# Patient Record
Sex: Male | Born: 1970 | Race: White | Hispanic: No | Smoking: Never smoker
Health system: Southern US, Community
[De-identification: ages and names within clinical notes are randomized; demographics above are authoritative.]

## PROBLEM LIST (undated history)

## (undated) DIAGNOSIS — N189 Chronic kidney disease, unspecified: Secondary | ICD-10-CM

## (undated) DIAGNOSIS — F329 Major depressive disorder, single episode, unspecified: Secondary | ICD-10-CM

## (undated) DIAGNOSIS — R0600 Dyspnea, unspecified: Secondary | ICD-10-CM

## (undated) DIAGNOSIS — F32A Depression, unspecified: Secondary | ICD-10-CM

## (undated) DIAGNOSIS — T7840XA Allergy, unspecified, initial encounter: Secondary | ICD-10-CM

## (undated) HISTORY — DX: Allergy, unspecified, initial encounter: T78.40XA

## (undated) HISTORY — DX: Major depressive disorder, single episode, unspecified: F32.9

## (undated) HISTORY — DX: Depression, unspecified: F32.A

---

## 2003-05-21 ENCOUNTER — Encounter: Admission: RE | Admit: 2003-05-21 | Discharge: 2003-05-21 | Payer: Self-pay | Admitting: Family Medicine

## 2005-06-23 ENCOUNTER — Ambulatory Visit (HOSPITAL_COMMUNITY): Admission: RE | Admit: 2005-06-23 | Discharge: 2005-06-23 | Payer: Self-pay | Admitting: *Deleted

## 2006-04-05 HISTORY — PX: OTHER SURGICAL HISTORY: SHX169

## 2006-05-23 ENCOUNTER — Ambulatory Visit (HOSPITAL_COMMUNITY): Admission: RE | Admit: 2006-05-23 | Discharge: 2006-05-23 | Payer: Self-pay | Admitting: Urology

## 2010-04-26 ENCOUNTER — Encounter: Payer: Self-pay | Admitting: Family Medicine

## 2011-04-28 ENCOUNTER — Ambulatory Visit: Payer: Self-pay | Admitting: Family Medicine

## 2011-05-12 ENCOUNTER — Ambulatory Visit (INDEPENDENT_AMBULATORY_CARE_PROVIDER_SITE_OTHER): Payer: 59 | Admitting: Family Medicine

## 2011-05-12 ENCOUNTER — Encounter: Payer: Self-pay | Admitting: Family Medicine

## 2011-05-12 DIAGNOSIS — J302 Other seasonal allergic rhinitis: Secondary | ICD-10-CM

## 2011-05-12 DIAGNOSIS — J309 Allergic rhinitis, unspecified: Secondary | ICD-10-CM

## 2011-05-12 DIAGNOSIS — R61 Generalized hyperhidrosis: Secondary | ICD-10-CM | POA: Insufficient documentation

## 2011-05-12 DIAGNOSIS — Z0000 Encounter for general adult medical examination without abnormal findings: Secondary | ICD-10-CM

## 2011-05-12 DIAGNOSIS — J45990 Exercise induced bronchospasm: Secondary | ICD-10-CM | POA: Insufficient documentation

## 2011-05-12 LAB — HEPATIC FUNCTION PANEL
Bilirubin, Direct: 0.1 mg/dL (ref 0.0–0.3)
Total Bilirubin: 0.8 mg/dL (ref 0.3–1.2)
Total Protein: 7.8 g/dL (ref 6.0–8.3)

## 2011-05-12 LAB — LIPID PANEL
Cholesterol: 225 mg/dL — ABNORMAL HIGH (ref 0–200)
Total CHOL/HDL Ratio: 5
Triglycerides: 171 mg/dL — ABNORMAL HIGH (ref 0.0–149.0)
VLDL: 34.2 mg/dL (ref 0.0–40.0)

## 2011-05-12 LAB — CBC WITH DIFFERENTIAL/PLATELET
Basophils Absolute: 0.1 10*3/uL (ref 0.0–0.1)
Eosinophils Absolute: 0.1 10*3/uL (ref 0.0–0.7)
HCT: 43.3 % (ref 39.0–52.0)
Hemoglobin: 15 g/dL (ref 13.0–17.0)
Lymphs Abs: 2 10*3/uL (ref 0.7–4.0)
MCHC: 34.6 g/dL (ref 30.0–36.0)
MCV: 92.8 fl (ref 78.0–100.0)
Neutro Abs: 4.1 10*3/uL (ref 1.4–7.7)
RDW: 13 % (ref 11.5–14.6)

## 2011-05-12 LAB — BASIC METABOLIC PANEL
Calcium: 9.8 mg/dL (ref 8.4–10.5)
Creatinine, Ser: 0.8 mg/dL (ref 0.4–1.5)

## 2011-05-12 LAB — TSH: TSH: 0.95 u[IU]/mL (ref 0.35–5.50)

## 2011-05-12 MED ORDER — ALBUTEROL SULFATE HFA 108 (90 BASE) MCG/ACT IN AERS: 2.0000 | INHALATION_SPRAY | RESPIRATORY_TRACT | Status: DC | PRN

## 2011-05-12 MED ORDER — FLUTICASONE PROPIONATE 50 MCG/ACT NA SUSP: 2.0000 | Freq: Every day | NASAL | Status: DC

## 2011-05-12 NOTE — Patient Instructions (Signed)
Schedule your complete physical at your convenience- you can eat before this! Continue the Zyrtec daily Add the Flonase- 2 sprays each nostril daily Use the Albuterol inhaler as needed for wheezing or shortness of breath We'll contact you w/ your lab results Call with any questions or concerns Welcome!  We're glad to have you!!!

## 2011-05-12 NOTE — Assessment & Plan Note (Signed)
New.  Unclear as to cause.  Onset correlated w/ recent illness.  Check labs to r/o infxn, thyroid abnormality.  Pt denies fevers.  Will follow.

## 2011-05-12 NOTE — Assessment & Plan Note (Signed)
New to provider.  Only symptomatic w/ running.  Has not used inhaler in years.  Will prescribe albuterol inhaler PRN.  Pt expressed understanding and is in agreement w/ plan.

## 2011-05-12 NOTE — Assessment & Plan Note (Signed)
New to provider.  Not well controlled on zyrtec alone.  Due to primary complaint of PND, will add nasal steroid to better control sxs.  Reviewed supportive care and red flags that should prompt return.  Pt expressed understanding and is in agreement w/ plan.

## 2011-05-12 NOTE — Progress Notes (Signed)
  Subjective:    Patient ID: Brian Holland, male    DOB: [birth year], 41 y.o.   MRN: [id number]  HPI New to establish.  Previous MD- Dr Toniann Fail at [facility].  Allergic rhinitis- has recently had 3 rounds of abx for what started as acute sinusitis (12/22).  Still having copious PND, decreased energy level.  Denies sneezing.  Taking OTC Zyrtec.  + itchy, watery eyes.  Mild nasal congestion.  No fevers.  No current ear pain.  Minimal cough.  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  Was previously using albuterol inhaler but not since 2008.  Denies wheezing or cough outside of exercise.  Night sweats- only head will sweat.  This started at time of sinus infection.  Is requiring him to sleep on a towel.  Occuring 5 out of 7 days.  Reports this does not change w/ ETOH intake (1-2 beers daily).  Denies sweating of body at this time.  No N/V/D, LAD, weight change.   Review of Systems For ROS see HPI     Objective:   Physical Exam  Constitutional: He is oriented to person, place, and time. He appears well-developed and well-nourished. No distress.  HENT:  Head: Normocephalic and atraumatic.       No TTP over sinuses + turbinate edema + PND TMs normal bilaterally  Eyes: Conjunctivae and EOM are normal. Pupils are equal, round, and reactive to light.  Neck: Normal range of motion. Neck supple. No thyromegaly present.  Cardiovascular: Normal rate, regular rhythm, normal heart sounds and intact distal pulses.   No murmur heard. Pulmonary/Chest: Effort normal and breath sounds normal. No respiratory distress. He has no wheezes.  Abdominal: Soft. Bowel sounds are normal. He exhibits no distension.  Musculoskeletal: He exhibits no edema.  Lymphadenopathy:    He has no cervical adenopathy.  Neurological: He is alert and oriented to person, place, and time. No cranial nerve deficit.  Skin: Skin is warm and dry.  Psychiatric: He has a normal mood and affect. His behavior is  normal.          Assessment & Plan:

## 2011-05-19 ENCOUNTER — Telehealth: Payer: Self-pay | Admitting: *Deleted

## 2011-05-19 NOTE — Telephone Encounter (Signed)
Thyroid is normal.  This is not the cause of his night sweats

## 2011-05-19 NOTE — Telephone Encounter (Signed)
Pt wife left vm wanting to discuss recent lab results, called and pt advised that he had discussed night sweats with MD Beverely Low during OV and there was no mention about his thyroid during the reading of his lab results, per MD Beverely Low thought this was possibly coming from thyroid concern, please advise

## 2011-05-20 NOTE — Telephone Encounter (Signed)
At this point, we don't know why he's having sweats.  The most reasonable answer is that it was due to his illness that started at the same time as the sweats.  Labs were normal.  Alcohol intake is low and not necessarily the cause but if he is still having the sweats he will need additional workup.

## 2011-05-20 NOTE — Telephone Encounter (Signed)
Called pt to advise instructions from MD Tabori, noted wife answered and requested instructions noted pt signed waiver for wife to have information, advised that pt has a normal thyroid, pt wife asked what is the possible cause for the night sweats, I advised that MD Beverely Low noted this could be due to the daily alcohol intake, wife stated "He does not drink that much, but ok, please send [place] a copy of those labs please" I advised that I will mail those to the verified address in the chart

## 2011-05-20 NOTE — Telephone Encounter (Signed)
Spoke to wife and gave instructions in prior note per MD Beverely Low, pt wife understood and will have pt call back in if his sxs do not subside for another OV, advised that I did mail the results to the home address. Pt wife understood

## 2012-04-07 ENCOUNTER — Encounter: Payer: Self-pay | Admitting: Family Medicine

## 2012-04-07 ENCOUNTER — Ambulatory Visit (INDEPENDENT_AMBULATORY_CARE_PROVIDER_SITE_OTHER): Payer: 59 | Admitting: Family Medicine

## 2012-04-07 VITALS — BP 100/58 | HR 85 | Temp 98.2°F | Ht 73.75 in | Wt 241.4 lb

## 2012-04-07 DIAGNOSIS — J329 Chronic sinusitis, unspecified: Secondary | ICD-10-CM

## 2012-04-07 MED ORDER — GUAIFENESIN-CODEINE 100-10 MG/5ML PO SYRP: 10.0000 mL | ORAL_SOLUTION | Freq: Three times a day (TID) | ORAL | Status: DC | PRN

## 2012-04-07 MED ORDER — AZITHROMYCIN 250 MG PO TABS: ORAL_TABLET | ORAL | Status: DC

## 2012-04-07 NOTE — Progress Notes (Signed)
  Subjective:    Patient ID: Brian Holland, male    DOB: 01-07-1971, 42 y.o.   MRN: [id number]  HPI URI- sxs started yesterday w/ excessive fatigue.  Woke up this AM w/ 'a pressure HA and my sinuses were completely full'.  + PND.  No fevers.  Mild nausea, no vomiting.  L ear pain.  + sore throat.  + dry cough.  No known sick contacts.   Review of Systems For ROS see HPI     Objective:   Physical Exam  Vitals reviewed. Constitutional: He appears well-developed and well-nourished. No distress.  HENT:  Head: Normocephalic and atraumatic.  Right Ear: Tympanic membrane normal.  Left Ear: Tympanic membrane normal.  Nose: Mucosal edema and rhinorrhea present. Right sinus exhibits maxillary sinus tenderness (mild) and frontal sinus tenderness (mild). Left sinus exhibits maxillary sinus tenderness (mild) and frontal sinus tenderness (mild).  Mouth/Throat: Mucous membranes are normal. Oropharyngeal exudate and posterior oropharyngeal erythema present. No posterior oropharyngeal edema.       + PND  Eyes: Conjunctivae normal and EOM are normal. Pupils are equal, round, and reactive to light.  Neck: Normal range of motion. Neck supple.  Cardiovascular: Normal rate, regular rhythm and normal heart sounds.   Pulmonary/Chest: Effort normal and breath sounds normal. No respiratory distress. He has no wheezes.       + hacking cough  Lymphadenopathy:    He has no cervical adenopathy.  Skin: Skin is warm and dry.          Assessment & Plan:

## 2012-04-07 NOTE — Patient Instructions (Addendum)
This is an early illness- difficult to say at this time whether it's viral or bacterial If your symptoms don't improve or worsen, start the Zpack as directed Use the cough syrup as needed- it will make you drowsy Mucinex to thin your congestion Tylenol/ibuprofen for pain or fever REST! Hang in there!!

## 2012-04-09 NOTE — Assessment & Plan Note (Signed)
New.  Pt's sxs are very early and discussed the difficulty in distinguishing viral from bacterial infxn at this time.  With coming weekend, will send Zpack for pt to start if sxs worsen.  Reviewed supportive care and red flags that should prompt return.  Pt expressed understanding and is in agreement w/ plan.

## 2012-04-14 ENCOUNTER — Telehealth: Payer: Self-pay | Admitting: Family Medicine

## 2012-04-14 NOTE — Telephone Encounter (Signed)
[facility] Triage Call Report Triage Record Num: [id number] Operator: Albertine Grates Patient Name: Brian Holland Call Date & Time: 04/13/2012 5:44:06PM Patient Phone: [phone] PCP: Lezlie Octave Patient Gender: Male PCP Fax : [phone] Patient DOB: 1970/04/09 Practice Name: [facility] Reason for Call: Caller: Cindy/Spouse; PCP: Sheliah Hatch.; CB#: [phone]; Was seen in office 1-3 and diagnosed with sinus infection. Has finished Z Pack. Continues to have cough/cold 1-9.Afebrile. Continues to cough up green mucus 1-9. Per Cough protocol, advised appointment 1-10 but declines. States patient is working and cannt get to office. Will call fror appointment 1-11. Protocol(s) Used: Cough - Adult Recommended Outcome per Protocol: See Provider within 24 hours Reason for Outcome: Productive cough with colored sputum (other than clear or white sputum) Care Advice: Increase fluids to 8-12 eight oz (1.6 to 2.4 liters) glasses per day, half of them to be water. Soups, popsicles, fruit juices, non-caffeinated sodas (unless restricting sodium intake), jello, broths, decaf teas, etc. are all okay. Warm fluids can be soothing. ~

## 2012-05-02 ENCOUNTER — Telehealth: Payer: Self-pay | Admitting: Family Medicine

## 2012-05-02 DIAGNOSIS — J45990 Exercise induced bronchospasm: Secondary | ICD-10-CM

## 2012-05-02 NOTE — Telephone Encounter (Signed)
Patient Refused Recommendation:  Patient Refused Appt, Patient Requests Appt At Later Date  Wants Appt for Fri 1/31 when he is off

## 2012-05-02 NOTE — Telephone Encounter (Signed)
Patient Information:  Caller Name: Arline Asp  Phone: [phone]  Patient: Brian Holland, Brian Holland  Gender: Male  DOB: 1970-04-29  Age: 42 Years  PCP: Sheliah Hatch.  Office Follow Up:  Does the office need to follow up with this patient?: Yes  Instructions For The Office: Please review.  Would like to make appt for Fri 1/31.  RN Note:  Needing to be seen today or tomorrow especially since no meds or plan of care to use for wheezing.  Wife says he is the only person at work and can not leave.  Has Friday off and would like to make appt for that day.  Symptoms  Reason For Call & Symptoms: Seen in office on 04/07/2012 for cough, congestion, given ZPak, Robitussin AC.  Still dry cough.  Has been wheezing whenever walking on stairs for the last week. Can sit and Sx receed.     Hx of exercise  induced asthma in the past but does not have medications in case of flairup.  Reviewed Health History In EMR: Yes  Reviewed Medications In EMR: Yes  Reviewed Allergies In EMR: Yes  Reviewed Surgeries / Procedures: Yes  Date of Onset of Symptoms: 03/31/2012  Guideline(s) Used:  Asthma Attack  Disposition Per Guideline:   See Today or Tomorrow in Office  Reason For Disposition Reached:   Intermittent mild wheezing persists > 5 days  Advice Given:  Drinking Liquids:  Try to drink normal amount of liquids (e.g., water). Being adequately hydrated makes it easier to cough up the sticky lung mucus.  Humidifier:   If the air is dry, use a cool mist humidifier to prevent drying of the upper airway.  Patient Refused Recommendation:  Patient Refused Appt, Patient Requests Appt At Later Date  Wants Appt for Fri 1/31 when he is off

## 2012-05-02 NOTE — Telephone Encounter (Signed)
Spoke with the pt's wife to see how the pt was doing, and she stated again that the pt was having Asthma related sxs and wheezing with walking up stairs. The pt does not have any meds for the sxs.  Asked the wife if we could give the pt an appt.   She stated that the pt could not miss work, and that Friday would be the only day he could come in because he will be off on Friday.  The wife stated that she knows we are concern about her husband, but if he get worse she will take him to the ER.  Wife asked to schedule an appt for Friday.  Connected her to the front and an appt for Friday was made.//AB/CMA

## 2012-05-03 MED ORDER — ALBUTEROL SULFATE HFA 108 (90 BASE) MCG/ACT IN AERS: 2.0000 | INHALATION_SPRAY | RESPIRATORY_TRACT | Status: AC | PRN

## 2012-05-03 NOTE — Telephone Encounter (Signed)
Spoke with the pt's wife(Cindy) and asked how the pt was doing.  She stated that the pt was doing good no problems(wheezing or SOB).   Asked her if the pt was using his inhaler?  She stated that the pt did not have an inhaler.  Informed her that Dr. Beverely Low said we could refill his inhaler if he did not have any.  She stated it would be great if we would refill the inhaler.  Asked her if the pt was still going to be seen on Friday, and she said yes we will see him on Friday.  Rx sent to the pharmacy by e-script.//AB/CMA

## 2012-05-03 NOTE — Telephone Encounter (Signed)
Pt has hx of asthma and has albuterol on his med list- should be using his inhaler regularly.  If no albuterol available, please refill

## 2012-05-05 ENCOUNTER — Ambulatory Visit (INDEPENDENT_AMBULATORY_CARE_PROVIDER_SITE_OTHER): Payer: 59 | Admitting: Family Medicine

## 2012-05-05 ENCOUNTER — Encounter: Payer: Self-pay | Admitting: Family Medicine

## 2012-05-05 VITALS — BP 110/80 | HR 75 | Temp 98.0°F | Ht 73.75 in | Wt 244.8 lb

## 2012-05-05 DIAGNOSIS — R21 Rash and other nonspecific skin eruption: Secondary | ICD-10-CM | POA: Insufficient documentation

## 2012-05-05 DIAGNOSIS — R5381 Other malaise: Secondary | ICD-10-CM

## 2012-05-05 DIAGNOSIS — R5383 Other fatigue: Secondary | ICD-10-CM

## 2012-05-05 LAB — BASIC METABOLIC PANEL
CO2: 28 mEq/L (ref 19–32)
Chloride: 104 mEq/L (ref 96–112)
Sodium: 138 mEq/L (ref 135–145)

## 2012-05-05 LAB — CBC WITH DIFFERENTIAL/PLATELET
Basophils Absolute: 0.1 10*3/uL (ref 0.0–0.1)
Hemoglobin: 15 g/dL (ref 13.0–17.0)
Lymphocytes Relative: 34.1 % (ref 12.0–46.0)
Monocytes Relative: 5.7 % (ref 3.0–12.0)
Platelets: 167 10*3/uL (ref 150.0–400.0)
RDW: 13 % (ref 11.5–14.6)

## 2012-05-05 LAB — POCT RAPID STREP A (OFFICE): Rapid Strep A Screen: NEGATIVE

## 2012-05-05 MED ORDER — CLOTRIMAZOLE-BETAMETHASONE 1-0.05 % EX CREA: TOPICAL_CREAM | Freq: Two times a day (BID) | CUTANEOUS | Status: DC

## 2012-05-05 NOTE — Patient Instructions (Addendum)
Use the Lotrisone cream twice daily on the feet Use Hydrocortisone cream on the face twice daily We'll notify you of your lab results Continue the inhaler as needed for shortness of breath REST!  Give yourself time to recover Hang in there!!!

## 2012-05-05 NOTE — Progress Notes (Signed)
  Subjective:    Patient ID: Brian Holland, male    DOB: 1970-09-29, 42 y.o.   MRN: [id number]  HPI URI- pt reports decreased energy, increased fatigue, SOB w/ carrying groceries or going up stairs.  Pt reports he has not improved since visit on 1/3.  Felt better on Zpack but then sxs returned.  Intermittent cough.  No fevers.  No facial pain/pressure.  Denies nasal congestion but + PND.  Not using nasal steroid.  Albuterol inhaler has helped w/ SOB.  Rash- on feet bilaterally, very itchy.  Now spreading to face.  Nothing on hands, trunk or groin.  No one at home w/ similar.  Pt thinks he had something similar last time he had abx and was dx'd w/ fungal infxn   Review of Systems For ROS see HPI     Objective:   Physical Exam  Vitals reviewed. Constitutional: He appears well-developed and well-nourished. No distress.  HENT:  Head: Normocephalic and atraumatic.       No TTP over sinuses + turbinate edema + PND TMs normal bilaterally  Eyes: Conjunctivae normal and EOM are normal. Pupils are equal, round, and reactive to light.  Neck: Normal range of motion. Neck supple. No thyromegaly present.  Cardiovascular: Normal rate, regular rhythm and normal heart sounds.   Pulmonary/Chest: Effort normal and breath sounds normal. No respiratory distress. He has no wheezes. He has no rales.       No cough heard  Lymphadenopathy:    He has no cervical adenopathy.  Skin: Skin is warm and dry. Rash (macular, on dorsum of feet bilaterally.  initially started as separate 'red dots' that coalesced) noted. There is erythema (on dorsum of feet bilaterally).          Assessment & Plan:

## 2012-05-07 NOTE — Assessment & Plan Note (Signed)
New.  Rapid strep negative which makes scarlet fever unlikely.  Distribution is abnormal but rash is similar.  Start combo steroid/antifungal for symptom improvement.  If no improvement, will need derm referral.  Pt expressed understanding and is in agreement w/ plan.

## 2012-05-07 NOTE — Assessment & Plan Note (Signed)
New.  Suspect this is normal post-infectious recovery and pt just needs time to improve but will check labs to r/o underlying abnormality.  Reviewed supportive care and red flags that should prompt return.  Pt expressed understanding and is in agreement w/ plan.

## 2012-05-20 ENCOUNTER — Other Ambulatory Visit: Payer: Self-pay

## 2012-06-13 ENCOUNTER — Encounter: Payer: Self-pay | Admitting: Family Medicine

## 2012-06-13 ENCOUNTER — Ambulatory Visit (INDEPENDENT_AMBULATORY_CARE_PROVIDER_SITE_OTHER): Payer: 59 | Admitting: Family Medicine

## 2012-06-13 VITALS — BP 112/60 | HR 82 | Temp 98.2°F | Ht 73.75 in | Wt 248.8 lb

## 2012-06-13 DIAGNOSIS — M545 Low back pain, unspecified: Secondary | ICD-10-CM

## 2012-06-13 DIAGNOSIS — F418 Other specified anxiety disorders: Secondary | ICD-10-CM

## 2012-06-13 DIAGNOSIS — F341 Dysthymic disorder: Secondary | ICD-10-CM

## 2012-06-13 MED ORDER — NAPROXEN 500 MG PO TABS: 500.0000 mg | ORAL_TABLET | Freq: Two times a day (BID) | ORAL | Status: AC

## 2012-06-13 MED ORDER — ESCITALOPRAM OXALATE 10 MG PO TABS: 10.0000 mg | ORAL_TABLET | Freq: Every day | ORAL | Status: DC

## 2012-06-13 MED ORDER — CYCLOBENZAPRINE HCL 10 MG PO TABS: 10.0000 mg | ORAL_TABLET | Freq: Three times a day (TID) | ORAL | Status: DC | PRN

## 2012-06-13 NOTE — Patient Instructions (Addendum)
Follow up in 1 month to recheck mood Start the lexapro daily The back pain appears to be a lumbar strain from shoveling Start the Naproxen twice daily- take w/ food- for 7-10 days Use the flexeril at night and on weekends.  It will make you drowsy (muscle relaxer) HEAT! Call with any questions or concerns Hang in there!

## 2012-06-13 NOTE — Progress Notes (Signed)
  Subjective:    Patient ID: Brian Holland, male    DOB: 07-08-1970, 42 y.o.   MRN: [id number]  HPI Back pain- sxs started Friday after shoveling.  Pain is L sided, lumbar.  Also having some L abdominal pain.  Hx of kidney stones but 'this doesn't feel similar'.  No hematuria.  No TTP over spine but pain is worse w/ movement.  Particularly bad if pt has been stationary for prolonged period.  'it's better when i'm up and moving'.  Pain radiating into L buttock and thigh.  Some weakness of L leg.  Causing pt to limp.  Has taken tylenol, ibuprofen, ASA w/out relief.  No relief w/ heat or ice.  Anxiety/depression- pt reports he finds himself worrying frequently.  'i find myself not wanting to do things'.  + sadness.  Sleeping fine.  Has lost enjoyment in things he used to enjoy.  Has been treated for anxiety/depression previously- celexa.  Find this worked 'a little'.     Review of Systems For ROS see HPI     Objective:   Physical Exam  Vitals reviewed. Constitutional: He is oriented to person, place, and time. He appears well-developed and well-nourished.  Obviously uncomfortable w/ ambulation and movement  HENT:  Head: Normocephalic and atraumatic.  Musculoskeletal: He exhibits no edema.  No TTP over spine or paraspinals Pain on L w/ extension>flexion (-) SLR bilaterally  Neurological: He is alert and oriented to person, place, and time. He has normal reflexes. No cranial nerve deficit. Coordination normal.  Skin: Skin is warm and dry.  Psychiatric: He has a normal mood and affect. His behavior is normal. Thought content normal.          Assessment & Plan:

## 2012-06-13 NOTE — Assessment & Plan Note (Signed)
New.  Appears to be musculoskeletal from shoveling.  Not consistent w/ kidney stone- worse w/ movement.  Start scheduled NSAIDs, flexeril.  Heat.  Reviewed supportive care and red flags that should prompt return.  Pt expressed understanding and is in agreement w/ plan.

## 2012-06-13 NOTE — Assessment & Plan Note (Signed)
New to provider.  Pt has hx of similar.  Previously on Celexa- thought it was 'a little' helpful.  Start lexapro.  Follow closely.

## 2012-07-10 ENCOUNTER — Other Ambulatory Visit: Payer: Self-pay | Admitting: Family Medicine

## 2012-09-25 ENCOUNTER — Other Ambulatory Visit: Payer: Self-pay | Admitting: Family Medicine

## 2012-09-25 DIAGNOSIS — F418 Other specified anxiety disorders: Secondary | ICD-10-CM

## 2012-09-28 ENCOUNTER — Telehealth: Payer: Self-pay

## 2012-09-28 NOTE — Telephone Encounter (Signed)
lmom for call back 6/26-needs ov

## 2012-09-28 NOTE — Telephone Encounter (Signed)
Left message on voice mail for call back. Refill request for Lexapro, was to follow up in 1 month. Needs to schedule appt before refill. ghf

## 2012-09-29 NOTE — Telephone Encounter (Signed)
Called patient and advised needs follow up. Scheduled. Will refill meds until appt. On 10/26/2012  GHF///RN

## 2012-10-26 ENCOUNTER — Ambulatory Visit: Payer: 59 | Admitting: Family Medicine

## 2013-02-08 ENCOUNTER — Other Ambulatory Visit: Payer: Self-pay

## 2014-01-25 ENCOUNTER — Ambulatory Visit: Payer: 59 | Admitting: Medical

## 2014-03-05 ENCOUNTER — Encounter: Payer: Self-pay | Admitting: Family Medicine

## 2014-03-11 ENCOUNTER — Encounter: Payer: Self-pay | Admitting: Family Medicine

## 2015-09-14 ENCOUNTER — Emergency Department (HOSPITAL_COMMUNITY)
Admission: EM | Admit: 2015-09-14 | Discharge: 2015-09-15 | Disposition: A | Discharge status: Home / Self Care | Payer: 59 | Attending: Emergency Medicine | Admitting: Emergency Medicine

## 2015-09-14 ENCOUNTER — Emergency Department (HOSPITAL_COMMUNITY): Payer: 59

## 2015-09-14 ENCOUNTER — Encounter (HOSPITAL_COMMUNITY): Payer: Self-pay | Admitting: Emergency Medicine

## 2015-09-14 DIAGNOSIS — K6389 Other specified diseases of intestine: Secondary | ICD-10-CM | POA: Insufficient documentation

## 2015-09-14 DIAGNOSIS — K529 Noninfective gastroenteritis and colitis, unspecified: Secondary | ICD-10-CM

## 2015-09-14 DIAGNOSIS — F329 Major depressive disorder, single episode, unspecified: Secondary | ICD-10-CM | POA: Diagnosis not present

## 2015-09-14 DIAGNOSIS — R1032 Left lower quadrant pain: Secondary | ICD-10-CM | POA: Diagnosis present

## 2015-09-14 DIAGNOSIS — J45909 Unspecified asthma, uncomplicated: Secondary | ICD-10-CM | POA: Diagnosis not present

## 2015-09-14 DIAGNOSIS — Z7951 Long term (current) use of inhaled steroids: Secondary | ICD-10-CM | POA: Insufficient documentation

## 2015-09-14 LAB — URINALYSIS, ROUTINE W REFLEX MICROSCOPIC
BILIRUBIN URINE: NEGATIVE
Glucose, UA: NEGATIVE mg/dL
HGB URINE DIPSTICK: NEGATIVE
Ketones, ur: NEGATIVE mg/dL
Leukocytes, UA: NEGATIVE
Nitrite: NEGATIVE
PH: 6.5 (ref 5.0–8.0)
Protein, ur: NEGATIVE mg/dL
SPECIFIC GRAVITY, URINE: 1.01 (ref 1.005–1.030)

## 2015-09-14 LAB — COMPREHENSIVE METABOLIC PANEL
ALBUMIN: 4.1 g/dL (ref 3.5–5.0)
ALK PHOS: 66 U/L (ref 38–126)
ALT: 20 U/L (ref 17–63)
ANION GAP: 6 (ref 5–15)
AST: 19 U/L (ref 15–41)
BUN: 12 mg/dL (ref 6–20)
CHLORIDE: 107 mmol/L (ref 101–111)
CO2: 28 mmol/L (ref 22–32)
Calcium: 9 mg/dL (ref 8.9–10.3)
Creatinine, Ser: 0.94 mg/dL (ref 0.61–1.24)
GFR calc Af Amer: >60 mL/min (ref >60)
GFR calc non Af Amer: >60 mL/min (ref >60)
GLUCOSE: 105 mg/dL — AB (ref 65–99)
POTASSIUM: 3.9 mmol/L (ref 3.5–5.1)
SODIUM: 141 mmol/L (ref 135–145)
Total Bilirubin: 0.7 mg/dL (ref 0.3–1.2)
Total Protein: 6.9 g/dL (ref 6.5–8.1)

## 2015-09-14 LAB — CBC
HEMATOCRIT: 40.9 % (ref 39.0–52.0)
HEMOGLOBIN: 14.6 g/dL (ref 13.0–17.0)
MCH: 31 pg (ref 26.0–34.0)
MCHC: 35.7 g/dL (ref 30.0–36.0)
MCV: 86.8 fL (ref 78.0–100.0)
Platelets: 156 10*3/uL (ref 150–400)
RBC: 4.71 MIL/uL (ref 4.22–5.81)
RDW: 12.4 % (ref 11.5–15.5)
WBC: 9.1 10*3/uL (ref 4.0–10.5)

## 2015-09-14 LAB — LIPASE, BLOOD: LIPASE: 31 U/L (ref 11–51)

## 2015-09-14 MED ORDER — KETOROLAC TROMETHAMINE 30 MG/ML IJ SOLN
30.0000 mg | Freq: Once | INTRAMUSCULAR | Status: AC
  Administered 2015-09-14: 30 mg via INTRAVENOUS
  Filled 2015-09-14: qty 1

## 2015-09-14 MED ORDER — IOPAMIDOL (ISOVUE-300) INJECTION 61%
100.0000 mL | Freq: Once | INTRAVENOUS | Status: AC | PRN
  Administered 2015-09-14: 100 mL via INTRAVENOUS

## 2015-09-14 NOTE — ED Notes (Signed)
Pt c/o LLQ abdominal pain radiating into abdomen onset Friday. Pt states 2 weeks ago he passed a kidney stone. No dysuria, frequency, n/v/diarrhea. Pt states that stream is interrupted, starts and stops.

## 2015-09-15 MED ORDER — OXYCODONE-ACETAMINOPHEN 5-325 MG PO TABS
1.0000 | ORAL_TABLET | Freq: Once | ORAL | Status: AC
  Administered 2015-09-15: 1 via ORAL
  Filled 2015-09-15: qty 1

## 2015-09-15 MED ORDER — HYDROCODONE-ACETAMINOPHEN 5-325 MG PO TABS: 1.0000 | ORAL_TABLET | Freq: Four times a day (QID) | ORAL | Status: DC | PRN

## 2015-09-15 MED ORDER — IBUPROFEN 800 MG PO TABS: 800.0000 mg | ORAL_TABLET | Freq: Three times a day (TID) | ORAL | Status: AC

## 2015-09-15 NOTE — ED Provider Notes (Signed)
CSN: [id number]     Arrival date & time 09/14/15  1850 History   First MD Initiated Contact with Patient 09/14/15 2156     Chief Complaint  Patient presents with  . Abdominal Pain    (Consider location/radiation/quality/duration/timing/severity/associated sxs/prior Treatment) Patient is a 45 y.o. male presenting with abdominal pain. The history is provided by the patient and medical records. No language interpreter was used.  Abdominal Pain Associated symptoms: no fever, no nausea and no vomiting     HIREN PEPLINSKI is a 45 y.o. male  with a PMH of asthma who presents to the Emergency Department complaining of aching non-radiating waxing-waning LLQ abdominal pain x 2 days. Associated symptoms include 1-2 episodes of loose stool. Denies nausea, vomiting, fever, back pain, chest pain. Tolerating PO fine. Tylenol taken PTA with little relief in symptoms. No aggravating or alleviating factors noted.  Two weeks ago he had left back pain which radiated toward LLQ and passed a kidney stone. Presentation today feels different but is in the same area.  Past Medical History  Diagnosis Date  . Asthma   . Depression   . Allergy    Past Surgical History  Procedure Laterality Date  . Lithotripcy  2008   History reviewed. No pertinent family history. Social History  Substance Use Topics  . Smoking status: Never Smoker   . Smokeless tobacco: None  . Alcohol Use: Yes     Comment: daily    Review of Systems  Constitutional: Negative for fever.  Gastrointestinal: Positive for abdominal pain. Negative for nausea and vomiting.   10 Systems reviewed and are negative for acute change except as noted in the HPI.   Allergies  Review of patient's allergies indicates no known allergies.  Home Medications   Prior to Admission medications   Medication Sig Start Date End Date Taking? Authorizing Provider  fluticasone (FLONASE) 50 MCG/ACT nasal spray USE TWO SPRAYS IN EACH NOSTRIL DAILY 07/10/12   Yes Sheliah Hatch, MD  levocetirizine (XYZAL) 5 MG tablet Take 5 mg by mouth every evening.   Yes Historical Provider, MD  albuterol (PROVENTIL HFA;VENTOLIN HFA) 108 (90 BASE) MCG/ACT inhaler Inhale 2 puffs into the lungs every 4 (four) hours as needed for wheezing or shortness of breath. 05/03/12 05/03/13  Sheliah Hatch, MD  clotrimazole-betamethasone (LOTRISONE) cream Apply topically 2 (two) times daily. 05/05/12   Sheliah Hatch, MD  cyclobenzaprine (FLEXERIL) 10 MG tablet Take 1 tablet (10 mg total) by mouth 3 (three) times daily as needed for muscle spasms. 06/13/12   Sheliah Hatch, MD  escitalopram (LEXAPRO) 10 MG tablet TAKE ONE TABLET BY MOUTH ONE TIME DAILY 09/25/12   Sheliah Hatch, MD  HYDROcodone-acetaminophen (NORCO/VICODIN) 5-325 MG tablet Take 1 tablet by mouth every 6 (six) hours as needed for severe pain. 09/15/15   Jaime Pilcher Ward, PA-C  ibuprofen (ADVIL,MOTRIN) 800 MG tablet Take 1 tablet (800 mg total) by mouth 3 (three) times daily. 09/15/15   Jaime Pilcher Ward, PA-C   BP 124/76 mmHg  Pulse 58  Temp(Src) 98.1 F (36.7 C) (Oral)  Resp 18  SpO2 99% Physical Exam  Constitutional: He is oriented to person, place, and time. He appears well-developed and well-nourished.  Alert and in no acute distress  HENT:  Head: Normocephalic and atraumatic.  Cardiovascular: Normal rate, regular rhythm, normal heart sounds and intact distal pulses.  Exam reveals no gallop and no friction rub.   No murmur heard. Pulmonary/Chest: Effort normal and breath sounds  normal. No respiratory distress. He has no wheezes. He has no rales. He exhibits no tenderness.  Abdominal: Soft. Bowel sounds are normal. He exhibits no distension and no mass. There is tenderness (LLQ focal tenderness). There is rebound (Mild). There is no guarding.  No CVA tenderness.   Musculoskeletal: He exhibits no edema.  Neurological: He is alert and oriented to person, place, and time.  Skin: Skin is warm  and dry.  Nursing note and vitals reviewed.   ED Course  Procedures (including critical care time) Labs Review Labs Reviewed  COMPREHENSIVE METABOLIC PANEL - Abnormal; Notable for the following:    Glucose, Bld 105 (*)    All other components within normal limits  LIPASE, BLOOD  CBC  URINALYSIS, ROUTINE W REFLEX MICROSCOPIC (NOT AT [facility])    Imaging Review Ct Abdomen Pelvis W Contrast  09/14/2015  CLINICAL DATA:  Acute onset of left lower quadrant abdominal pain. Initial encounter. EXAM: CT ABDOMEN AND PELVIS WITH CONTRAST TECHNIQUE: Multidetector CT imaging of the abdomen and pelvis was performed using the standard protocol following bolus administration of intravenous contrast. CONTRAST:  ISOVUE-300 IOPAMIDOL (ISOVUE-300) INJECTION 61% COMPARISON:  Abdominal radiograph performed 05/30/2006, and CT of the abdomen and pelvis performed 05/09/2006 FINDINGS: The visualized lung bases are clear. A 1.8 cm cyst is noted at the right hepatic lobe. The liver is otherwise unremarkable. The spleen is enlarged, measuring 14.4 cm in length. The gallbladder is within normal limits. The pancreas and adrenal glands are unremarkable. A 9 mm nonobstructing stone is noted at the upper pole of the left kidney. Minimal bilateral perinephric stranding is seen. The kidneys are otherwise unremarkable in appearance. There is no evidence of hydronephrosis. No obstructing ureteral stones are identified. No free fluid is identified. The small bowel is unremarkable in appearance. The stomach is within normal limits. No acute vascular abnormalities are seen. The appendix is normal in caliber, without evidence of appendicitis. Mild soft tissue inflammation is noted about the proximal sigmoid colon, surrounding a lobule of fat, thought to reflect epiploic appendagitis. No significant diverticulosis is seen. The bladder is mildly distended. The prostate remains normal in size. No inguinal lymphadenopathy is seen. No acute  osseous abnormalities are identified. IMPRESSION: 1. Mild soft tissue inflammation about the proximal sigmoid colon, surrounding a lobule of fat. This appearance is compatible with epiploic appendagitis. 2. Small hepatic cyst noted. 3. Splenomegaly. 4. 9 mm nonobstructing stone at the upper pole of the left kidney. Electronically Signed   By: Roanna Raider M.D.   On: 09/14/2015 23:48   I have personally reviewed and evaluated these images and lab results as part of my medical decision-making.   EKG Interpretation None      MDM   Final diagnoses:  Epiploic appendagitis   DYKE WEIBLE presents to ED for LLQ abdominal pain x 2 days. No fever, nausea, vomiting. On exam, afebrile and VSS. Focal TTP of LLQ. CBC, CMP, lipase, UA reviewed and reassuring. CT abdomen shows mild soft tissue inflammation around the proximal sigmoid colon with surrounding a lobule of fat consistent with epiploic appendagitis. Findings are consistent with tenderness on exam. We will treat with NSAIDs and short course of narcotic pain medication. Patient has PCP he can follow up with easily and appears reliable to do so. Very strict return precautions and follow-up instructions were given to patient and family at bedside. PCP follow-up strongly encouraged. All questions answered.  Patient discussed with Dr. Radford Pax who agrees with treatment plan.  [facility] Psych[facility] HospitalJaime Pilcher Ward, PA-C 09/15/15 0119  Paula LibraJohn Molpus, MD 09/15/15 [phone]

## 2015-09-15 NOTE — Discharge Instructions (Signed)
Take ibuprofen as directed until pain subsides. Take pain medication only as needed for severe pain - This can make you very drowsy - please do not drink alcohol, operate heavy machinery or drive on this medication. Follow up with your primary care physician in 1 week, earlier if symptoms worsen.   Please seek immediate care if you develop any of the following symptoms: The pain does not go away.  You have a fever.  You keep throwing up (vomiting). You pass bloody or black tarry stools.  There is bright red blood in the stool.  Constipation stays for more than 4 days. There is rectal pain.  You do not seem to be getting better.  You have any questions or concerns.

## 2015-09-15 NOTE — ED Notes (Signed)
Asher MuirJamie, PA at bedside.

## 2016-02-28 ENCOUNTER — Emergency Department (HOSPITAL_COMMUNITY)
Admission: EM | Admit: 2016-02-28 | Discharge: 2016-02-29 | Disposition: A | Discharge status: Home / Self Care | Payer: 59 | Attending: Emergency Medicine | Admitting: Emergency Medicine

## 2016-02-28 ENCOUNTER — Emergency Department (HOSPITAL_COMMUNITY): Payer: 59

## 2016-02-28 DIAGNOSIS — R0781 Pleurodynia: Secondary | ICD-10-CM

## 2016-02-28 DIAGNOSIS — W010XXA Fall on same level from slipping, tripping and stumbling without subsequent striking against object, initial encounter: Secondary | ICD-10-CM | POA: Insufficient documentation

## 2016-02-28 DIAGNOSIS — S00212A Abrasion of left eyelid and periocular area, initial encounter: Secondary | ICD-10-CM | POA: Insufficient documentation

## 2016-02-28 DIAGNOSIS — S62339A Displaced fracture of neck of unspecified metacarpal bone, initial encounter for closed fracture: Secondary | ICD-10-CM

## 2016-02-28 DIAGNOSIS — S62307A Unspecified fracture of fifth metacarpal bone, left hand, initial encounter for closed fracture: Secondary | ICD-10-CM | POA: Insufficient documentation

## 2016-02-28 DIAGNOSIS — Y929 Unspecified place or not applicable: Secondary | ICD-10-CM | POA: Diagnosis not present

## 2016-02-28 DIAGNOSIS — Y939 Activity, unspecified: Secondary | ICD-10-CM | POA: Insufficient documentation

## 2016-02-28 DIAGNOSIS — M25561 Pain in right knee: Secondary | ICD-10-CM | POA: Insufficient documentation

## 2016-02-28 DIAGNOSIS — Y999 Unspecified external cause status: Secondary | ICD-10-CM | POA: Insufficient documentation

## 2016-02-28 DIAGNOSIS — J45909 Unspecified asthma, uncomplicated: Secondary | ICD-10-CM | POA: Diagnosis not present

## 2016-02-28 DIAGNOSIS — Z23 Encounter for immunization: Secondary | ICD-10-CM | POA: Insufficient documentation

## 2016-02-28 DIAGNOSIS — S6992XA Unspecified injury of left wrist, hand and finger(s), initial encounter: Secondary | ICD-10-CM | POA: Diagnosis present

## 2016-02-28 MED ORDER — TETANUS-DIPHTH-ACELL PERTUSSIS 5-2.5-18.5 LF-MCG/0.5 IM SUSP
0.5000 mL | Freq: Once | INTRAMUSCULAR | Status: AC
  Administered 2016-02-28: 0.5 mL via INTRAMUSCULAR
  Filled 2016-02-28: qty 0.5

## 2016-02-28 MED ORDER — HYDROCODONE-ACETAMINOPHEN 5-325 MG PO TABS
2.0000 | ORAL_TABLET | Freq: Once | ORAL | Status: AC
  Administered 2016-02-28: 2 via ORAL
  Filled 2016-02-28: qty 2

## 2016-02-28 MED ORDER — HYDROCODONE-ACETAMINOPHEN 5-325 MG PO TABS: 1.0000 | ORAL_TABLET | Freq: Four times a day (QID) | ORAL | 0 refills | Status: DC | PRN

## 2016-02-28 NOTE — ED Triage Notes (Signed)
Pt c/o tripping on his shoelace and falling tonight. Pt L hand is swollen and painful. Also complaining of R knee pain and L rib pain where he landed. Laceration noted to L eyebrow. No LOC, no dizziness. A&Ox4. Ambulatory with a steady gait.

## 2016-02-28 NOTE — ED Provider Notes (Signed)
[facility]-EMERGENCY DEPT Provider Note   CSN: [id number] Arrival date & time: 02/28/16  2205     History   Chief Complaint Chief Complaint  Patient presents with  . Fall  . Hand Pain    HPI Brian Holland is a 45 y.o. male.  Patient presents to the emergency department with chief complaint of fall from yesterday. Patient states that he was tying his shoe, and tripped catching himself with his left hand and landing on his right knee. He also complains of left-sided rib pain and a small abrasion to his left eyebrow. He did not pass out. He denies any dizziness. He reports that the symptoms in his hand are worsened with movement and palpation. He also reports associated swelling. He states that his right knee is tender to palpation, but he has been able to ambulate. He also states the regarding his left rib pain it hurts when he leans backward and when he takes deep breath. He has not taken anything for her symptoms.   The history is provided by the patient. No language interpreter was used.    Past Medical History:  Diagnosis Date  . Allergy   . Asthma   . Depression     Patient Active Problem List   Diagnosis Date Noted  . Depression with anxiety 06/13/2012  . Lumbar back pain 06/13/2012  . Rash 05/05/2012  . Fatigue 05/05/2012  . Sinusitis 04/07/2012  . Allergic rhinitis, seasonal 05/12/2011  . Exercise-induced asthma 05/12/2011  . Night sweats 05/12/2011    Past Surgical History:  Procedure Laterality Date  . lithotripcy  2008       Home Medications    Prior to Admission medications   Medication Sig Start Date End Date Taking? Authorizing Provider  albuterol (PROVENTIL HFA;VENTOLIN HFA) 108 (90 BASE) MCG/ACT inhaler Inhale 2 puffs into the lungs every 4 (four) hours as needed for wheezing or shortness of breath. 05/03/12 05/03/13  Sheliah HatchKatherine E Tabori, MD  clotrimazole-betamethasone (LOTRISONE) cream Apply topically 2 (two) times daily. 05/05/12   Sheliah HatchKatherine E  Tabori, MD  cyclobenzaprine (FLEXERIL) 10 MG tablet Take 1 tablet (10 mg total) by mouth 3 (three) times daily as needed for muscle spasms. 06/13/12   Sheliah HatchKatherine E Tabori, MD  escitalopram (LEXAPRO) 10 MG tablet TAKE ONE TABLET BY MOUTH ONE TIME DAILY 09/25/12   Sheliah HatchKatherine E Tabori, MD  fluticasone [facility](FLONASE) 50 MCG/ACT nasal spray USE [facility] SPRAYS [facility] 07/10/12   Sheliah HatchKatherine E Tabori, MD  HYDROcodone-acetaminophen (NORCO/VICODIN) 5-325 MG tablet Take 1 tablet by mouth every 6 (six) hours as needed for severe pain. 09/15/15   Jaime Pilcher Ward, PA-C  ibuprofen (ADVIL,MOTRIN) 800 MG tablet Take 1 tablet (800 mg total) by mouth 3 (three) times daily. 09/15/15   Chase PicketJaime Pilcher Ward, PA-C  levocetirizine (XYZAL) 5 MG tablet Take 5 mg by mouth every evening.    Historical Provider, MD    Family History No family history on file.  Social History Social History  Substance Use Topics  . Smoking status: Never Smoker  . Smokeless tobacco: Not on file  . Alcohol use Yes     Comment: daily     Allergies   Patient has no known allergies.   Review of Systems Review of Systems  All other systems reviewed and are negative.    Physical Exam Updated Vital Signs BP 142/96 (BP Location: Left Arm)   Pulse 70   Temp 97.8 F (36.6 C) (Oral)   Resp 18  Ht 6\' 3"  (1.905 m)   Wt 115.7 kg   SpO2 99%   BMI 31.87 kg/m   Physical Exam  Constitutional: He is oriented to person, place, and time. He appears well-developed and well-nourished.  HENT:  Head: Normocephalic and atraumatic.  Eyes: Conjunctivae and EOM are normal. Pupils are equal, round, and reactive to light. Right eye exhibits no discharge. Left eye exhibits no discharge. No scleral icterus.  Neck: Normal range of motion. Neck supple. No JVD present.  Cardiovascular: Normal rate, regular rhythm and normal heart sounds.  Exam reveals no gallop and no friction rub.   No murmur heard. Pulmonary/Chest: Effort normal and breath sounds  normal. No respiratory distress. He has no wheezes. He has no rales. He exhibits no tenderness.  Left RIBS nontender to palpation Clear to auscultation  Abdominal: Soft. He exhibits no distension and no mass. There is no tenderness. There is no rebound and no guarding.  Musculoskeletal: Normal range of motion. He exhibits no edema or tenderness.  Left upper extremity: Left hand tender to palpation over the fifth metacarpal, moderate swelling, range of motion strength limited secondary to pain, left wrist range of motion strength 5/5  Right knee tender to palpation anteriorly, no obvious swelling, no bony abnormality or deformity, range of motion strength 5/5, patient is ambulatory  Neurological: He is alert and oriented to person, place, and time.  Skin: Skin is warm and dry.  Mild abrasion to left eyebrow, no laceration requiring repair  Psychiatric: He has a normal mood and affect. His behavior is normal. Judgment and thought content normal.  Nursing note and vitals reviewed.    ED Treatments / Results  Labs (all labs ordered are listed, but only abnormal results are displayed) Labs Reviewed - No data to display  EKG  EKG Interpretation None       Radiology No results found.  Procedures Procedures (including critical care time) SPLINT APPLICATION Date/Time: 12:27 AM Authorized by: Roxy Horseman Consent: Verbal consent obtained. Risks and benefits: risks, benefits and alternatives were discussed Consent given by: patient Splint applied by: orthopedic technician Location details: left hand Splint type: ulnar gutter Supplies used: fiberglass and ace Post-procedure: The splinted body part was neurovascularly unchanged following the procedure. Patient tolerance: Patient tolerated the procedure well with no immediate complications.    Medications Ordered in ED Medications  HYDROcodone-acetaminophen (NORCO/VICODIN) 5-325 MG per tablet 2 tablet (not administered)  Tdap  (BOOSTRIX) injection 0.5 mL (not administered)     Initial Impression / Assessment and Plan / ED Course  I have reviewed the triage vital signs and the nursing notes.  Pertinent labs & imaging results that were available during my care of the patient were reviewed by me and considered in my medical decision making (see chart for details).  Clinical Course     Patient with mechanical fall yesterday. Left fifth metacarpal fracture. Will splint with an older gutter splint. Recommend orthopedic follow-up.  Laceration does not require repair, but the patient does need a tetanus booster.  Treatment pain, recommend ice, and close follow-up.  Incentive spirometry.  Patient X-Ray negative for obvious fracture or dislocation.  Pt advised to follow up with orthopedics. Patient given splint while in ED, conservative therapy recommended and discussed. Patient will be discharged home & is agreeable with above plan. Returns precautions discussed. Pt appears safe for discharge.   Final Clinical Impressions(s) / ED Diagnoses   Final diagnoses:  Closed boxer's fracture, initial encounter  Rib pain on left side  Acute pain of right knee    New Prescriptions Discharge Medication List as of 02/28/2016 11:56 PM       Roxy Horsemanobert Rickie Gange, PA-C 02/29/16 0028    Tilden FossaElizabeth Rees, MD 02/29/16 1540

## 2016-02-29 ENCOUNTER — Encounter: Payer: Self-pay | Admitting: Family Medicine

## 2016-04-13 DIAGNOSIS — S62347D Nondisplaced fracture of base of fifth metacarpal bone. left hand, subsequent encounter for fracture with routine healing: Secondary | ICD-10-CM | POA: Diagnosis not present

## 2016-05-04 DIAGNOSIS — S62347D Nondisplaced fracture of base of fifth metacarpal bone. left hand, subsequent encounter for fracture with routine healing: Secondary | ICD-10-CM | POA: Diagnosis not present

## 2016-05-31 DIAGNOSIS — S62347D Nondisplaced fracture of base of fifth metacarpal bone. left hand, subsequent encounter for fracture with routine healing: Secondary | ICD-10-CM | POA: Diagnosis not present

## 2016-06-23 DIAGNOSIS — J45909 Unspecified asthma, uncomplicated: Secondary | ICD-10-CM | POA: Diagnosis not present

## 2016-07-14 DIAGNOSIS — J45909 Unspecified asthma, uncomplicated: Secondary | ICD-10-CM | POA: Diagnosis not present

## 2016-08-25 DIAGNOSIS — R0982 Postnasal drip: Secondary | ICD-10-CM | POA: Diagnosis not present

## 2016-09-07 DIAGNOSIS — N202 Calculus of kidney with calculus of ureter: Secondary | ICD-10-CM | POA: Diagnosis not present

## 2016-09-21 DIAGNOSIS — N202 Calculus of kidney with calculus of ureter: Secondary | ICD-10-CM | POA: Diagnosis not present

## 2016-09-21 DIAGNOSIS — R8271 Bacteriuria: Secondary | ICD-10-CM | POA: Diagnosis not present

## 2016-09-22 ENCOUNTER — Other Ambulatory Visit: Payer: Self-pay | Admitting: Urology

## 2016-09-24 ENCOUNTER — Encounter (HOSPITAL_COMMUNITY): Payer: Self-pay | Admitting: *Deleted

## 2016-09-26 NOTE — H&P (Signed)
Office Visit Report     09/21/2016  --------------------------------------------------------------------------------  Brian Holland. Brian Holland  MRN: [id number]  PRIMARY CARE:    DOB: 08-19-1970, 46 year old Male  REFERRING:    SSN: *[id number]  PROVIDER:  Ihor Holland, M.D.    TREATING:  Brian Holland    LOCATION:  [facility] [phone]  -------------------------------------------------------------------------------  CC: I have kidney stones.  HPI: Brian Holland is a 46 year-old male established patient who is here for renal calculi. Calculus disease: He underwent ESWL for a left UPJ stone in 2/08 by Dr. Sherron Monday.  Stone analysis: Calcium oxalate 30% and calcium phosphate 60%.  24-hour urine: He was noted to have hypercalciuria and hyperoxaluria with mild hypocitraturia.  CT scan 09/14/15: 9 x 6 mm nonobstructing left upper pole renal calculus with Hounsfield units of ~680.  He has passed a total of 4 stones. Last stone he passed was in 3/17.  Stone risk: His serum studies were normal with a normal PTH and serum calcium. His 24-hour urine revealed a volume of 2.15 L with the only abnormality having been hyperoxaluria.  Treatment: Low oxalate diet.   Interval history 03/08/16: No new urologic complaints are noted. He has returned to go over the results of his stone risk evaluation.   Interval history 09/07/16: He said that about 2 months ago he began having intermittent pain in the left flank and left lower quadrant. About 2 weeks ago he had a 48 hour period of gross hematuria. He has not had any change in his voiding pattern. His pain is not modified by positional change.  June 19 interval: Stone noted at distal left ureter all last office visit imaging study. Patient denies much in the way of any stone material passage. Continues to have some intermittent left lower quadrant abdominal/groin pain and discomfort as well as some left flank pain. He has noted some progressively  worsening lower urinary tract symptoms mostly in the form of urgency and hesitancy. Denies dysuria or gross hematuria. Denies fevers. Pain has been managed with oxycodone. He remains on an alpha blocker as well.   The problem is on the left side. He first stated noticing pain on approximately 07/04/2016. This is not his first kidney stone. He has had 1 stones prior to getting this one. He is currently having groin pain. He denies having flank pain, back pain, nausea, vomiting, fever, and chills. He has not caught a stone in his urine strainer since his symptoms began.   He has had eswl for treatment of his stones in the past.    ALLERGIES: No Allergies  MEDICATIONS: None   GU PSH: ESWL  NON-GU PSH: None   GU PMH: Renal calculus (Stable), Left, His left lower pole renal stone remains unchanged. He has not developed any new right or left renal calculi. - 09/07/2016, (Stable), Left, Low oxalate diet and return in 6 months for KUB., - 03/08/2016 (Stable, Chronic), Left, He has passed 4 stones and has a single stone in his kidney now. We discussed repeating his 24-hour urine and serum studies for stone risk and then he'll return to go over those results., - 01/19/2016, Left, 9 mm, upper pole and nonobstructing, - 09/14/2015 Ureteral calculus (Acute), Left, He has a left distal ureteral stone that is 5 mm in width and we discussed the options for treatment. He is familiar with these. We discussed lithotripsy, ureteroscopy and medical expulsive therapy. He would like to proceed with medical expulsive therapy so I  will place him on Rapaflo and give him a prescription for pain medication with follow-up in 2 weeks for KUB. - 09/07/2016     PMH Notes:  1898-04-05 00:00:00 - Note: Normal Routine History And Physical Adult   NON-GU PMH: Asthma, Asthma - 2014 Anxiety Depression GERD   FAMILY HISTORY: None   SOCIAL HISTORY: Marital Status: Widowed Current Smoking Status: Patient has never smoked.  Social  Drinker.  Drinks 3 caffeinated drinks per day.     Notes: Tobacco Use, Caffeine Use, Occupation:, Marital History - Currently Married, Alcohol Use   REVIEW OF SYSTEMS:    GU Review Male:   Patient reports frequent urination. Patient denies hard to postpone urination, burning/ pain with urination, get up at night to urinate, leakage of urine, stream starts and stops, trouble starting your stream, have to strain to urinate , erection problems, and penile pain.  Gastrointestinal (Upper):   Patient denies nausea, vomiting, and indigestion/ heartburn.  Gastrointestinal (Lower):   Patient denies diarrhea and constipation.  Constitutional:   Patient denies fever, night sweats, weight loss, and fatigue.  Skin:   Patient denies skin rash/ lesion and itching.  Eyes:   Patient denies blurred vision and double vision.  Ears/ Nose/ Throat:   Patient reports sinus problems. Patient denies sore throat.  Hematologic/Lymphatic:   Patient denies swollen glands and easy bruising.  Cardiovascular:   Patient denies leg swelling and chest pains.  Respiratory:   Patient reports cough and shortness of breath.   Endocrine:   Patient denies excessive thirst.  Musculoskeletal:   Patient denies back pain and joint pain.  Neurological:   Patient denies headaches and dizziness.  Psychologic:   Patient reports anxiety. Patient denies depression.   VITAL SIGNS:      09/21/2016 03:47 PM  BP 126/78 mmHg  Pulse 69 /min   MULTI-SYSTEM PHYSICAL EXAMINATION:    Constitutional: Well-nourished. No physical deformities. Normally developed. Good grooming.  Respiratory: No labored breathing, no use of accessory muscles. RRR.  Cardiovascular: Normal temperature, normal extremity pulses, no swelling, no varicosities. CTA.  Skin: No paleness, no jaundice, no cyanosis. No lesion, no ulcer, no rash.  Neurologic / Psychiatric: Oriented to time, oriented to place, oriented to person. No depression, no anxiety, no agitation.   Gastrointestinal: No mass, no tenderness, no rigidity, non obese abdomen. No CVAT.  Musculoskeletal: Spine, ribs, pelvis no bilateral tenderness. Normal gait and station of head and neck.    PAST DATA REVIEWED:  Source Of History:  Patient, Family/Caregiver  Records Review:   Previous Patient Records  Urine Test Review:   Urinalysis  X-Ray Review: KUB: Reviewed Films.     09/21/16  Urinalysis  Urine Appearance Clear   Urine Color Yellow   Urine Glucose Neg   Urine Bilirubin Neg   Urine Ketones Neg   Urine Specific Gravity 1.015   Urine Blood Neg   Urine pH 6.5   Urine Protein Neg   Urine Urobilinogen 0.2   Urine Nitrites Neg   Urine Leukocyte Esterase Neg    PROCEDURES:         KUB - [id number]  A single view of the abdomen is obtained.     Stable nonobstructing calculi remains unchanged in the left lower pole. The previously noted left distal ureteral calculi overlying the left sacral edge may have descended a few millimeters but grossly remains unchanged measuring around 5 mm. No other obvious renal or ureteral calculi identified today. Bowel gas pattern appeared within normal limits.  Bladder appears free of obstruction. No new bony abnormalities.        Urinalysis Dipstick Dipstick Cont'd  Color: Yellow Bilirubin: Neg  Appearance: Clear Ketones: Neg  Specific Gravity: 1.015 Blood: Neg  pH: 6.5 Protein: Neg  Glucose: Neg Urobilinogen: 0.2    Nitrites: Neg    Leukocyte Esterase: Neg    ASSESSMENT:      ICD-10 Details  1 GU:   Renal calculus - N20.0 Left  2   Ureteral calculus - N20.1 Left   PLAN:          Medications Refill Meds: Oxycodone Hcl 10 mg tablet 1-2 tablet PO Q 6 H   #20  0 Refill(s)    New Meds: Tamsulosin Hcl 0.4 mg capsule, ext release 24 hr 1 capsule PO Daily   #30  2 Refill(s)  Tamsulosin Hcl 0.4 mg capsule, ext release 24 hr 1 capsule PO Daily   #30  2 Refill(s)         Orders Labs Urine Culture        Schedule Return Visit/Planned Activity: 1  Week - Schedule Surgery        Document Letter(s):  Created for Patient: Clinical Summary       Notes:   Patient is still moderately symptomatic. Stone has not moved much in the way of progression toward the bladder on medical expulsive therapy. I discussed with his urologist that the patient would be a good candidate for shockwave therapy or ureteroscopy if desired. Risks and benefits of both procedures discussed with the patient. He has had shockwave lithotripsy in the past. This is his preference. We did discuss the complications of shockwave lithotripsy including need for additional procedures, hematoma, obstructing fragments, and ongoing pain. Medications refilled. I'll get a sheet to Pam to try and get the patient scheduled for early next week if possible. Appropriate follow-up instructions given for worsening symptoms.   ** Signed by Brian Holland on 09/22/16 at 7:56 AM (EDT)**   The information contained in this medical record document is considered private and confidential patient information. This information can only be used for the medical diagnosis and/or medical services that are being provided by the patient's selected caregivers. This information can only be distributed outside of the patient's care if the patient agrees and signs waivers of authorization for this information to be sent to an outside source or route. Add: urine cx no growth. I reviewed the notes, chart, labs, imaging. Appears the left UP stone is on the move in the left distal ureter.

## 2016-09-27 ENCOUNTER — Encounter (HOSPITAL_COMMUNITY)
Admission: RE | Disposition: A | Discharge status: Home / Self Care | Payer: Self-pay | Source: Ambulatory Visit | Attending: Urology

## 2016-09-27 ENCOUNTER — Ambulatory Visit (HOSPITAL_COMMUNITY)
Admission: RE | Admit: 2016-09-27 | Discharge: 2016-09-27 | Disposition: A | Discharge status: Home / Self Care | Payer: 59 | Source: Ambulatory Visit | Attending: Urology | Admitting: Urology

## 2016-09-27 ENCOUNTER — Encounter (HOSPITAL_COMMUNITY): Payer: Self-pay | Admitting: Urology

## 2016-09-27 DIAGNOSIS — N201 Calculus of ureter: Secondary | ICD-10-CM | POA: Diagnosis not present

## 2016-09-27 DIAGNOSIS — J45909 Unspecified asthma, uncomplicated: Secondary | ICD-10-CM | POA: Diagnosis not present

## 2016-09-27 DIAGNOSIS — Z01818 Encounter for other preprocedural examination: Secondary | ICD-10-CM | POA: Diagnosis not present

## 2016-09-27 HISTORY — DX: Chronic kidney disease, unspecified: N18.9

## 2016-09-27 HISTORY — PX: EXTRACORPOREAL SHOCK WAVE LITHOTRIPSY: SHX1557

## 2016-09-27 HISTORY — DX: Dyspnea, unspecified: R06.00

## 2016-09-27 SURGERY — LITHOTRIPSY, ESWL
Anesthesia: LOCAL | Laterality: Left

## 2016-09-27 MED ORDER — ONDANSETRON HCL 4 MG PO TABS: 4.0000 mg | ORAL_TABLET | Freq: Three times a day (TID) | ORAL | 1 refills | Status: AC | PRN

## 2016-09-27 MED ORDER — CIPROFLOXACIN HCL 500 MG PO TABS
500.0000 mg | ORAL_TABLET | ORAL | Status: AC
  Administered 2016-09-27: 500 mg via ORAL
  Filled 2016-09-27: qty 1

## 2016-09-27 MED ORDER — OXYCODONE HCL 10 MG PO TABS: 10.0000 mg | ORAL_TABLET | Freq: Four times a day (QID) | ORAL | 0 refills | Status: AC | PRN

## 2016-09-27 MED ORDER — DIPHENHYDRAMINE HCL 25 MG PO CAPS
25.0000 mg | ORAL_CAPSULE | ORAL | Status: AC
  Administered 2016-09-27: 25 mg via ORAL
  Filled 2016-09-27: qty 1

## 2016-09-27 MED ORDER — OXYCODONE-ACETAMINOPHEN 5-325 MG PO TABS
2.0000 | ORAL_TABLET | Freq: Once | ORAL | Status: AC
  Administered 2016-09-27: 2 via ORAL
  Filled 2016-09-27: qty 2

## 2016-09-27 MED ORDER — DIAZEPAM 5 MG PO TABS
10.0000 mg | ORAL_TABLET | ORAL | Status: AC
  Administered 2016-09-27: 10 mg via ORAL
  Filled 2016-09-27: qty 2

## 2016-09-27 MED ORDER — SODIUM CHLORIDE 0.9 % IV SOLN
INTRAVENOUS | Status: DC
  Administered 2016-09-27: 09:00:00 via INTRAVENOUS

## 2016-09-27 NOTE — Interval H&P Note (Signed)
History and Physical Interval Note:  09/27/2016 10:01 AM  Brian ServerAndrew J Holland  has presented today for surgery, with the diagnosis of LEFT URETERAL STONE  The various methods of treatment have been discussed with the patient and family. After consideration of risks, benefits and other options for treatment, the patient has consented to  Procedure(s): LEFT EXTRACORPOREAL SHOCK WAVE LITHOTRIPSY (ESWL) (Left) as a surgical intervention .  The patient's history has been reviewed, patient examined, no change in status, stable for surgery.  I have reviewed the patient's chart and labs.  Questions were answered to the patient's satisfaction.  Stable KUB - 12 mm left distal stone. LLP stone remains. Pt well - no dysuria, F/C.    Brian Holland

## 2016-09-27 NOTE — Discharge Instructions (Signed)
Lithotripsy, Care After °This sheet gives you information about how to care for yourself after your procedure. Your health care provider may also give you more specific instructions. If you have problems or questions, contact your health care provider. °What can I expect after the procedure? °After the procedure, it is common to have: °· Some blood in your urine. This should only last for a few days. °· Soreness in your back, sides, or upper abdomen for a few days. °· Blotches or bruises on your back where the pressure wave entered the skin. °· Pain, discomfort, or nausea when pieces (fragments) of the kidney stone move through the tube that carries urine from the kidney to the bladder (ureter). Stone fragments may pass soon after the procedure, but they may continue to pass for up to 4-8 weeks. °? If you have severe pain or nausea, contact your health care provider. This may be caused by a large stone that was not broken up, and this may mean that you need more treatment. °· Some pain or discomfort during urination. °· Some pain or discomfort in the lower abdomen or (in men) at the base of the penis. ° °Follow these instructions at home: °Medicines °· Take over-the-counter and prescription medicines only as told by your health care provider. °· If you were prescribed an antibiotic medicine, take it as told by your health care provider. Do not stop taking the antibiotic even if you start to feel better. °· Do not drive for 24 hours if you were given a medicine to help you relax (sedative). °· Do not drive or use heavy machinery while taking prescription pain medicine. °Eating and drinking °· Drink enough water and fluids to keep your urine clear or pale yellow. This helps any remaining pieces of the stone to pass. It can also help prevent new stones from forming. °· Eat plenty of fresh fruits and vegetables. °· Follow instructions from your health care provider about eating and drinking restrictions. You may be  instructed: °? To reduce how much salt (sodium) you eat or drink. Check ingredients and nutrition facts on packaged foods and beverages. °? To reduce how much meat you eat. °· Eat the recommended amount of calcium for your age and gender. Ask your health care provider how much calcium you should have. °General instructions °· Get plenty of rest. °· Most people can resume normal activities 1-2 days after the procedure. Ask your health care provider what activities are safe for you. °· If directed, strain all urine through the strainer that was provided by your health care provider. °? Keep all fragments for your health care provider to see. Any stones that are found may be sent to a medical lab for examination. The stone may be as small as a grain of salt. °· Keep all follow-up visits as told by your health care provider. This is important. °Contact a health care provider if: °· You have pain that is severe or does not get better with medicine. °· You have nausea that is severe or does not go away. °· You have blood in your urine longer than your health care provider told you to expect. °· You have more blood in your urine. °· You have pain during urination that does not go away. °· You urinate more frequently than usual and this does not go away. °· You develop a rash or any other possible signs of an allergic reaction. °Get help right away if: °· You have severe pain in   your back, sides, or upper abdomen. °· You have severe pain while urinating. °· Your urine is very dark red. °· You have blood in your stool (feces). °· You cannot pass any urine at all. °· You feel a strong urge to urinate after emptying your bladder. °· You have a fever or chills. °· You develop shortness of breath, difficulty breathing, or chest pain. °· You have severe nausea that leads to persistent vomiting. °· You faint. °Summary °· After this procedure, it is common to have some pain, discomfort, or nausea when pieces (fragments) of the  kidney stone move through the tube that carries urine from the kidney to the bladder (ureter). If this pain or nausea is severe, however, you should contact your health care provider. °· Most people can resume normal activities 1-2 days after the procedure. Ask your health care provider what activities are safe for you. °· Drink enough water and fluids to keep your urine clear or pale yellow. This helps any remaining pieces of the stone to pass, and it can help prevent new stones from forming. °· If directed, strain your urine and keep all fragments for your health care provider to see. Fragments or stones may be as small as a grain of salt. °· Get help right away if you have severe pain in your back, sides, or upper abdomen or have severe pain while urinating. °This information is not intended to replace advice given to you by your health care provider. Make sure you discuss any questions you have with your health care provider. °Document Released: 04/11/2007 Document Revised: 02/11/2016 Document Reviewed: 02/11/2016 °Elsevier Interactive Patient Education © 2017 [employer] ° ° °Moderate Conscious Sedation, Adult, Care After °These instructions provide you with information about caring for yourself after your procedure. Your health care provider may also give you more specific instructions. Your treatment has been planned according to current medical practices, but problems sometimes occur. Call your health care provider if you have any problems or questions after your procedure. °What can I expect after the procedure? °After your procedure, it is common: °· To feel sleepy for several hours. °· To feel clumsy and have poor balance for several hours. °· To have poor judgment for several hours. °· To vomit if you eat too soon. ° °Follow these instructions at home: °For at least 24 hours after the procedure: ° °· Do not: °? Participate in activities where you could fall or become injured. °? Drive. °? Use heavy  machinery. °? Drink alcohol. °? Take sleeping pills or medicines that cause drowsiness. °? Make important decisions or sign legal documents. °? Take care of children on your own. °· Rest. °Eating and drinking °· Follow the diet recommended by your health care provider. °· If you vomit: °? Drink water, juice, or soup when you can drink without vomiting. °? Make sure you have little or no nausea before eating solid foods. °General instructions °· Have a responsible adult stay with you until you are awake and alert. °· Take over-the-counter and prescription medicines only as told by your health care provider. °· If you smoke, do not smoke without supervision. °· Keep all follow-up visits as told by your health care provider. This is important. °Contact a health care provider if: °· You keep feeling nauseous or you keep vomiting. °· You feel light-headed. °· You develop a rash. °· You have a fever. °Get help right away if: °· You have trouble breathing. °This information is not intended to   replace advice given to you by your health care provider. Make sure you discuss any questions you have with your health care provider. °Document Released: 01/10/2013 Document Revised: 08/25/2015 Document Reviewed: 07/12/2015 °Elsevier Interactive Patient Education © 2018 [employer] ° ° ° °Dietary Guidelines to Help Prevent Kidney Stones °Kidney stones are deposits of minerals and salts that form inside your kidneys. Your risk of developing kidney stones may be greater depending on your diet, your lifestyle, the medicines you take, and whether you have certain medical conditions. Most people can reduce their chances of developing kidney stones by following the instructions below. Depending on your overall health and the type of kidney stones you tend to develop, your dietitian may give you more specific instructions. °What are tips for following this plan? °Reading food labels °· Choose foods with "no salt added" or "low-salt"  labels. Limit your sodium intake to less than 1500 mg per day. °· Choose foods with calcium for each meal and snack. Try to eat about 300 mg of calcium at each meal. Foods that contain 200-500 mg of calcium per serving include: °? 8 oz (237 ml) of milk, fortified nondairy milk, and fortified fruit juice. °? 8 oz (237 ml) of kefir, yogurt, and soy yogurt. °? 4 oz (118 ml) of tofu. °? 1 oz of cheese. °? 1 cup (300 g) of dried figs. °? 1 cup (91 g) of cooked broccoli. °? 1-3 oz can of sardines or mackerel. °· Most people need 1000 to 1500 mg of calcium each day. Talk to your dietitian about how much calcium is recommended for you. °Shopping °· Buy plenty of fresh fruits and vegetables. Most people do not need to avoid fruits and vegetables, even if they contain nutrients that may contribute to kidney stones. °· When shopping for convenience foods, choose: °? Whole pieces of fruit. °? Premade salads with dressing on the side. °? Low-fat fruit and yogurt smoothies. °· Avoid buying frozen meals or prepared deli foods. °· Look for foods with live cultures, such as yogurt and kefir. °Cooking °· Do not add salt to food when cooking. Place a salt shaker on the table and allow each person to add his or her own salt to taste. °· Use vegetable protein, such as beans, textured vegetable protein (TVP), or tofu instead of meat in pasta, casseroles, and soups. °Meal planning °· Eat less salt, if told by your dietitian. To do this: °? Avoid eating processed or premade food. °? Avoid eating fast food. °· Eat less animal protein, including cheese, meat, poultry, or fish, if told by your dietitian. To do this: °? Limit the number of times you have meat, poultry, fish, or cheese each week. Eat a diet free of meat at least 2 days a week. °? Eat only one serving each day of meat, poultry, fish, or seafood. °? When you prepare animal protein, cut pieces into small portion sizes. For most meat and fish, one serving is about the size of one  deck of cards. °· Eat at least 5 servings of fresh fruits and vegetables each day. To do this: °? Keep fruits and vegetables on hand for snacks. °? Eat 1 piece of fruit or a handful of berries with breakfast. °? Have a salad and fruit at lunch. °? Have two kinds of vegetables at dinner. °· Limit foods that are high in a substance called oxalate. These include: °? Spinach. °? Rhubarb. °? Beets. °? Potato chips and french fries. °? Nuts. °· If   you regularly take a diuretic medicine, make sure to eat at least 1-2 fruits or vegetables high in potassium each day. These include: °? Avocado. °? Banana. °? Orange, prune, carrot, or tomato juice. °? Baked potato. °? Cabbage. °? Beans and split peas. °General instructions °· Drink enough fluid to keep your urine clear or pale yellow. This is the most important thing you can do. °· Talk to your health care provider and dietitian about taking daily supplements. Depending on your health and the cause of your kidney stones, you may be advised: °? Not to take supplements with vitamin C. °? To take a calcium supplement. °? To take a daily probiotic supplement. °? To take other supplements such as magnesium, fish oil, or vitamin B6. °· Take all medicines and supplements as told by your health care provider. °· Limit alcohol intake to no more than 1 drink a day for nonpregnant women and 2 drinks a day for men. One drink equals 12 oz of beer, 5 oz of wine, or 1½ oz of hard liquor. °· Lose weight if told by your health care provider. Work with your dietitian to find strategies and an eating plan that works best for you. °What foods are not recommended? °Limit your intake of the following foods, or as told by your dietitian. Talk to your dietitian about specific foods you should avoid based on the type of kidney stones and your overall health. °Grains °Breads. Bagels. Rolls. Baked goods. Salted crackers. Cereal. Pasta. °Vegetables °Spinach. Rhubarb. Beets. Canned vegetables. Pickles.  Olives. °Meats and other protein foods °Nuts. Nut butters. Large portions of meat, poultry, or fish. Salted or cured meats. Deli meats. Hot dogs. Sausages. °Dairy °Cheese. °Beverages °Regular soft drinks. Regular vegetable juice. °Seasonings and other foods °Seasoning blends with salt. Salad dressings. Canned soups. Soy sauce. Ketchup. Barbecue sauce. Canned pasta sauce. Casseroles. Pizza. Lasagna. Frozen meals. Potato chips. French fries. °Summary °· You can reduce your risk of kidney stones by making changes to your diet. °· The most important thing you can do is drink enough fluid. You should drink enough fluid to keep your urine clear or pale yellow. °· Ask your health care provider or dietitian how much protein from animal sources you should eat each day, and also how much salt and calcium you should have each day. °This information is not intended to replace advice given to you by your health care provider. Make sure you discuss any questions you have with your health care provider. °Document Released: 07/17/2010 Document Revised: 03/02/2016 Document Reviewed: 03/02/2016 °Elsevier Interactive Patient Education © 2017 [employer] ° ° ° °

## 2016-09-27 NOTE — Op Note (Signed)
Left distal 12 mm ureteral stone  Left ESWL  Findings: good fragmentation

## 2016-10-11 DIAGNOSIS — R0981 Nasal congestion: Secondary | ICD-10-CM | POA: Diagnosis not present

## 2016-10-11 DIAGNOSIS — N201 Calculus of ureter: Secondary | ICD-10-CM | POA: Diagnosis not present

## 2016-11-11 DIAGNOSIS — N201 Calculus of ureter: Secondary | ICD-10-CM | POA: Diagnosis not present

## 2016-11-16 DIAGNOSIS — J309 Allergic rhinitis, unspecified: Secondary | ICD-10-CM | POA: Diagnosis not present

## 2016-11-16 DIAGNOSIS — R0982 Postnasal drip: Secondary | ICD-10-CM | POA: Diagnosis not present

## 2016-11-16 DIAGNOSIS — J343 Hypertrophy of nasal turbinates: Secondary | ICD-10-CM | POA: Diagnosis not present

## 2016-11-19 DIAGNOSIS — J3489 Other specified disorders of nose and nasal sinuses: Secondary | ICD-10-CM | POA: Diagnosis not present

## 2016-11-19 DIAGNOSIS — R0982 Postnasal drip: Secondary | ICD-10-CM | POA: Diagnosis not present

## 2016-12-26 IMAGING — CR DG KNEE COMPLETE 4+V*R*
4 series · 4 of 4 positions shown · non-contrast
Comparison: None.

CLINICAL DATA: Anterior right knee pain after trip and fall last
night.

EXAM:
RIGHT KNEE - COMPLETE 4+ VIEW

[t knee ap right]
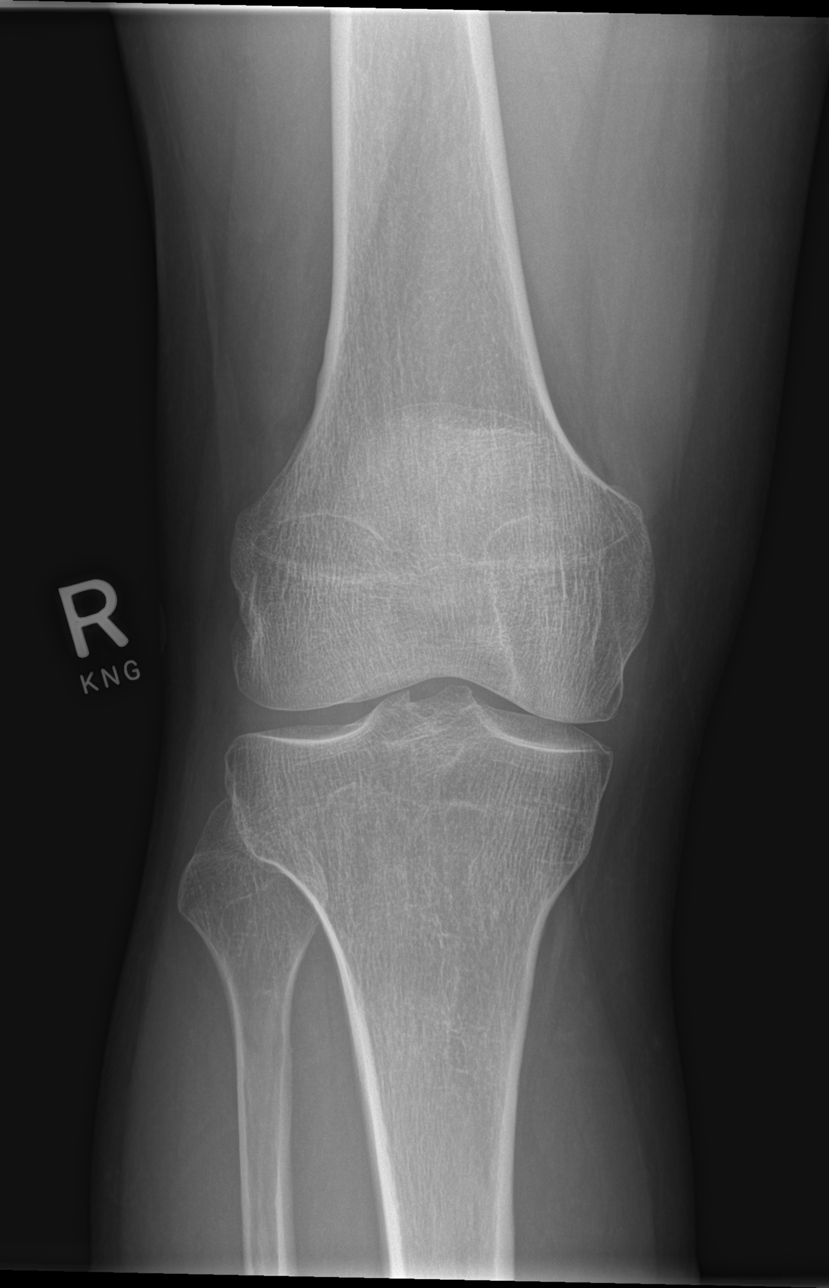

[t knee obl right (1 of 2)]
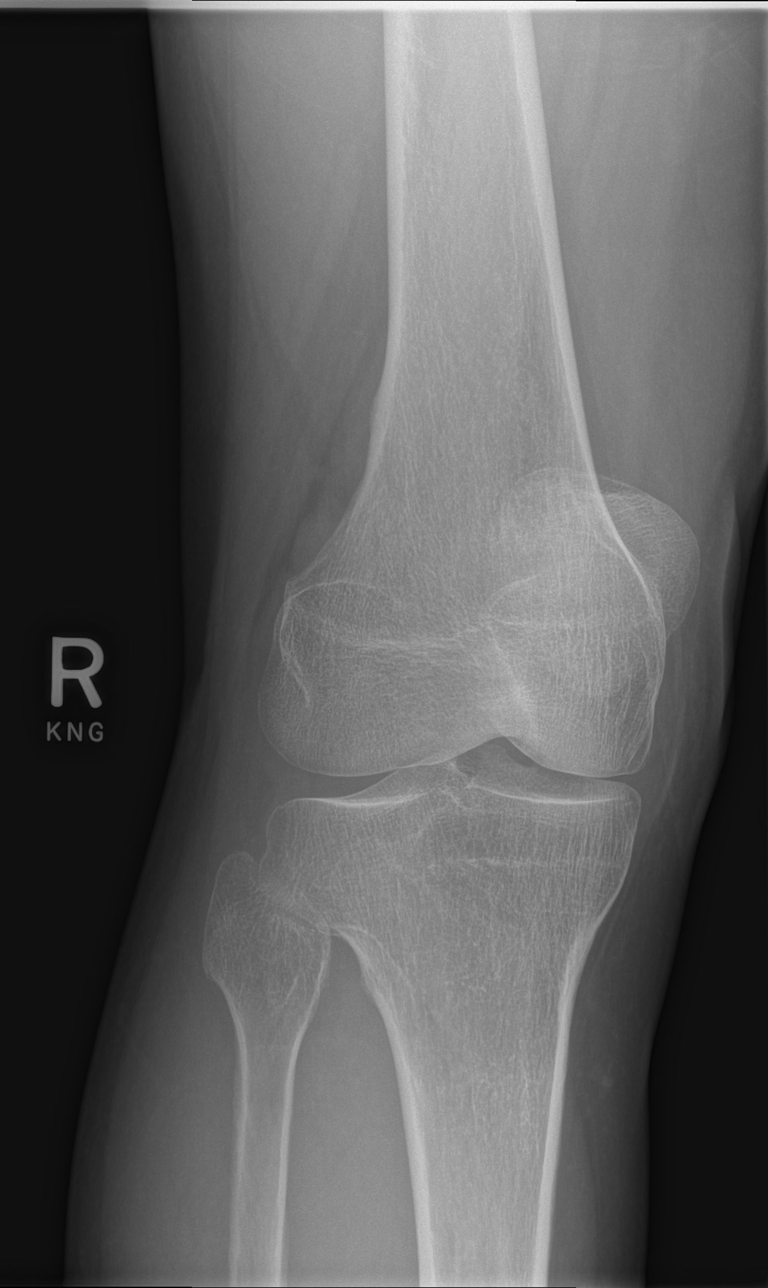

[t knee obl right (2 of 2)]
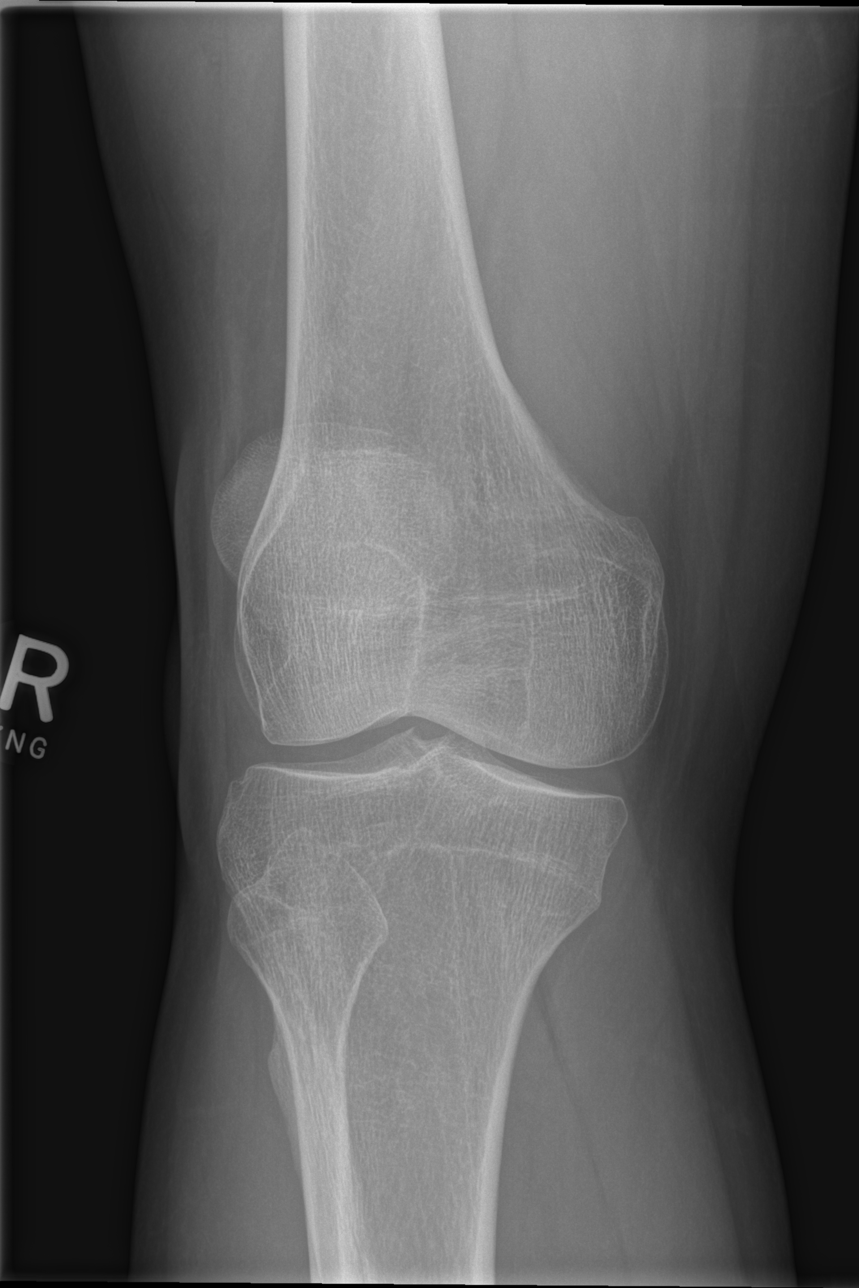

[t knee lat right]
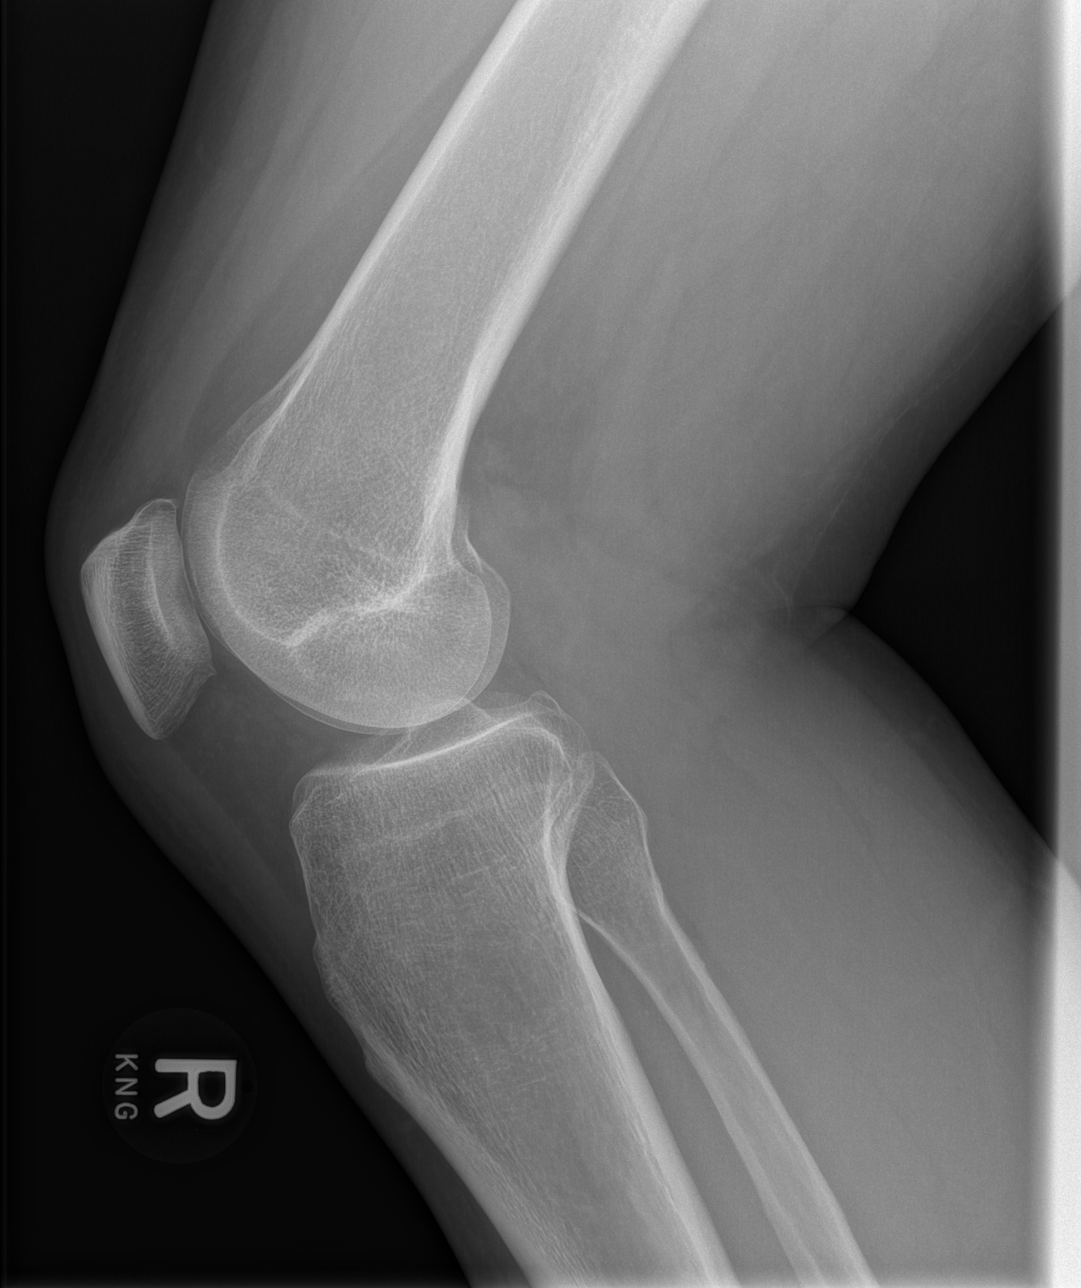

[4 of 4 positions shown; findings below may reference images not displayed]

FINDINGS: No evidence of fracture, dislocation, or joint effusion. No evidence
of arthropathy or other focal bone abnormality. Soft tissues are
unremarkable.
IMPRESSION: Negative.

## 2017-07-26 IMAGING — CR DG ABDOMEN 1V
2 series · 2 of 2 positions shown · non-contrast
Comparison: [HOSPITAL] [facility] 09/21/2016, and
earlier. Prior CT Abdomen and Pelvis 09/14/2015.

CLINICAL DATA: 46-year-old male preoperative study for "Left
ureteral stone" .

EXAM:
ABDOMEN - 1 VIEW

[t abdomen supine (1 of 2)]
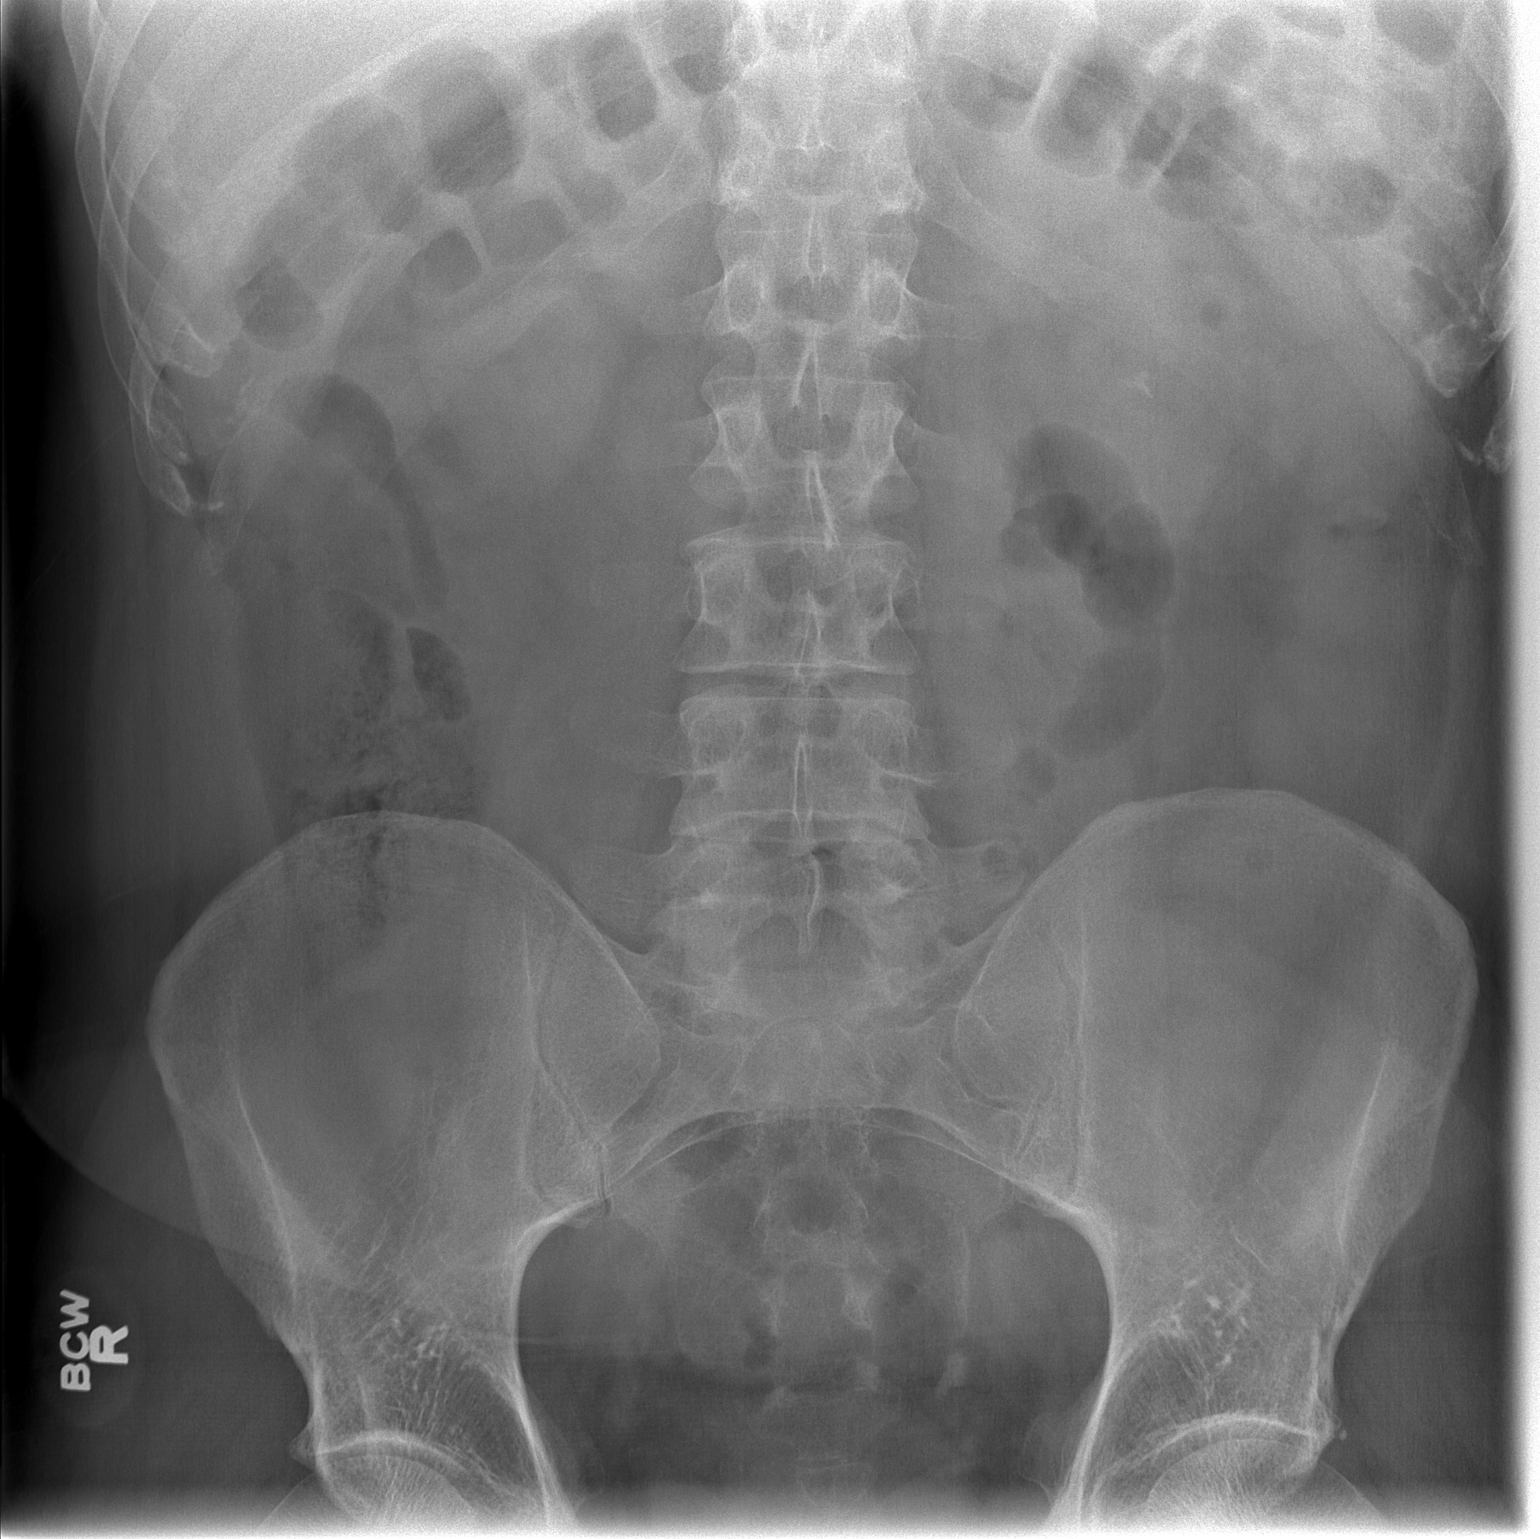

[t abdomen supine (2 of 2)]
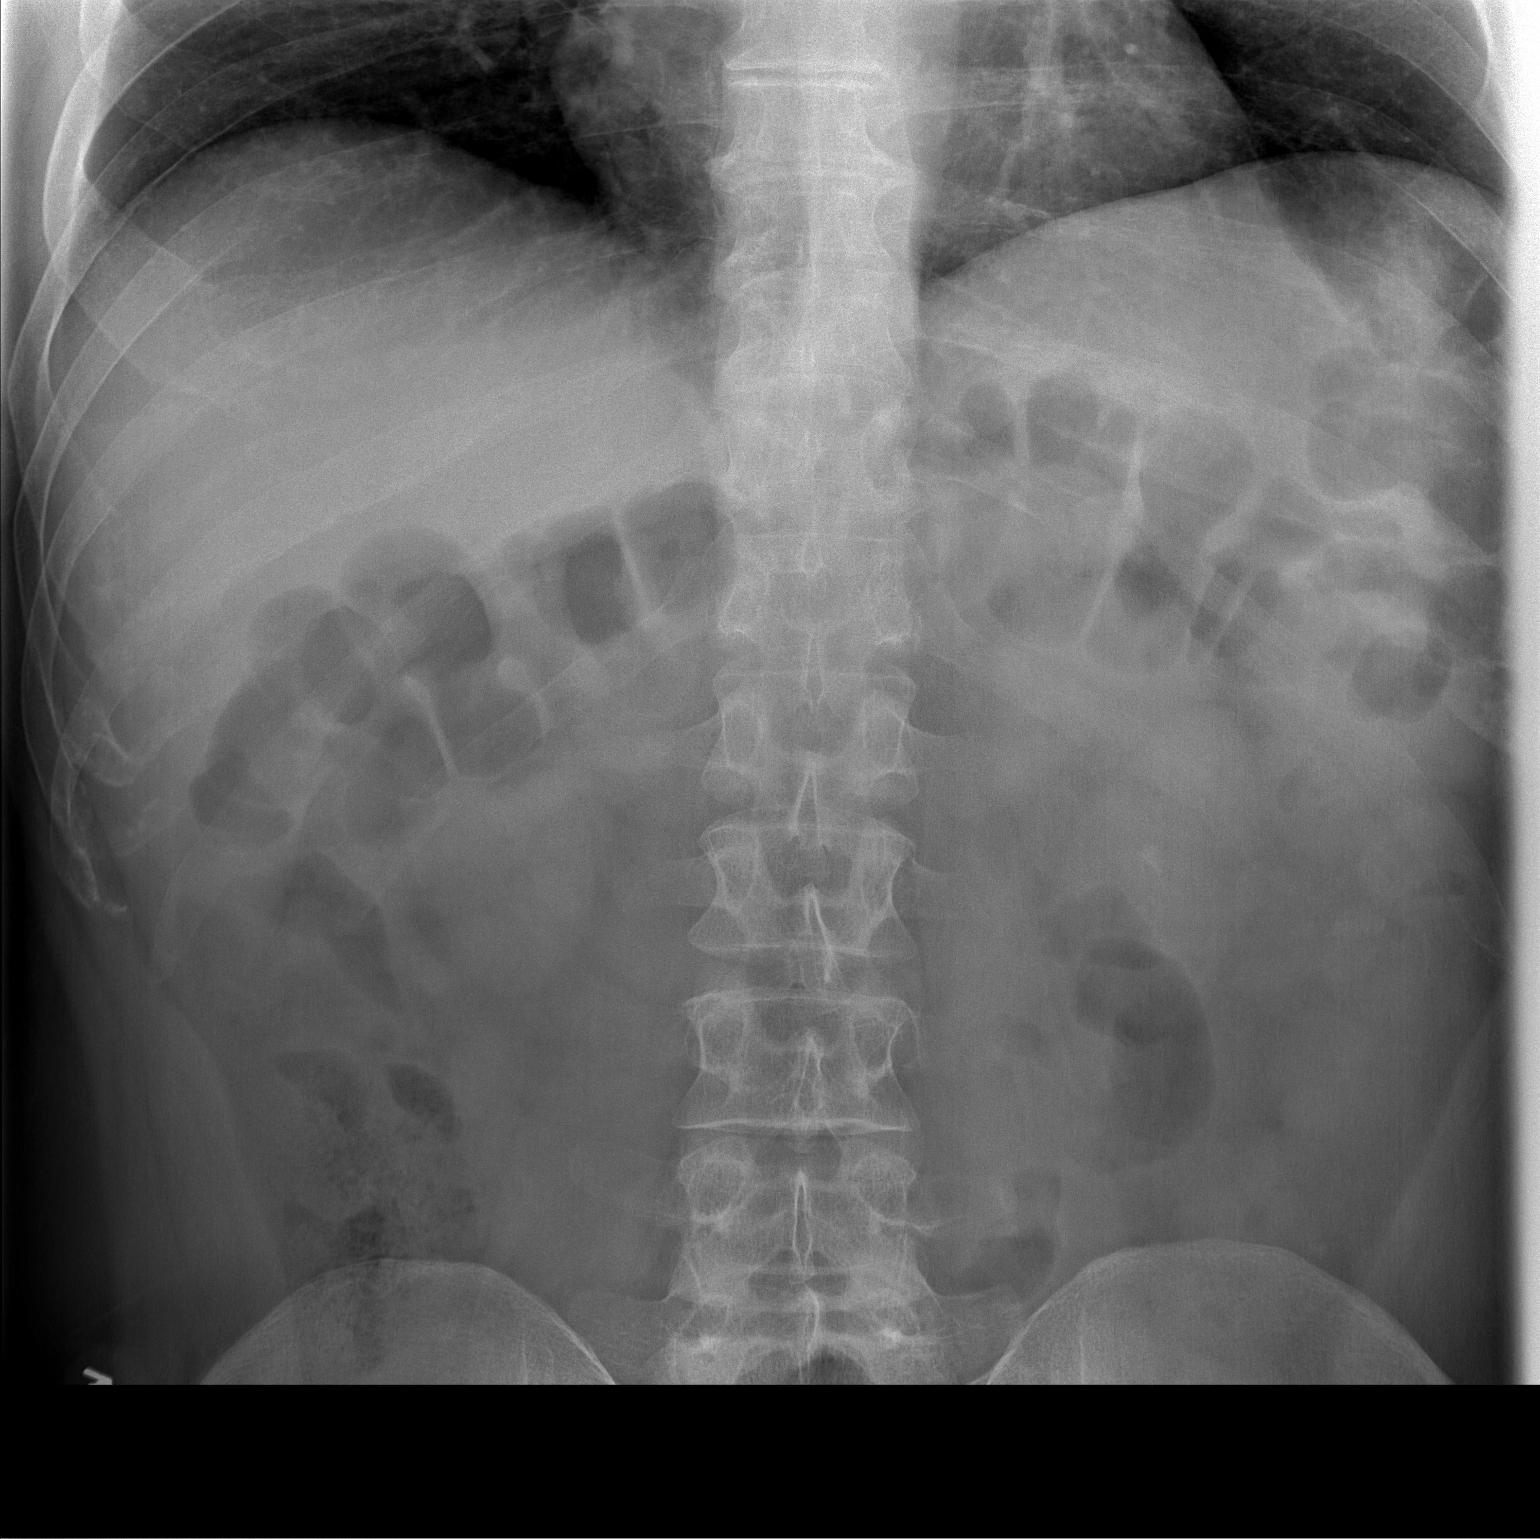

[2 of 2 positions shown; findings below may reference images not displayed]

FINDINGS: There is a 12 mm calcification, or a grouping of calcifications in
the left hemipelvis along the lower left sacral ala margin, which is
new since the 9216 CT (not atherosclerosis or phlebolith related).
The position in configuration has not significantly changed since
09/07/2016.

Chronic left nephrolithiasis. A 10 mm triangular calculus projects
over the left renal midpole. No right renal calculus is evident.

Negative lung bases. Non obstructed bowel gas pattern. No acute
osseous abnormality identified.
IMPRESSION: 1. Distal left ureteral 12 mm stone or grouping of stones, not
significantly changed since 09/07/2016.
[DATE]. Stable left midpole nephrolithiasis.
